# Patient Record
Sex: Male | Born: 1955
Health system: Southern US, Community
[De-identification: ages and names within clinical notes are randomized; demographics above are authoritative.]

## PROBLEM LIST (undated history)

## (undated) DIAGNOSIS — B019 Varicella without complication: Secondary | ICD-10-CM

## (undated) DIAGNOSIS — I871 Compression of vein: Secondary | ICD-10-CM

## (undated) DIAGNOSIS — R569 Unspecified convulsions: Secondary | ICD-10-CM

## (undated) DIAGNOSIS — B399 Histoplasmosis, unspecified: Secondary | ICD-10-CM

## (undated) HISTORY — DX: Varicella without complication: B01.9

## (undated) HISTORY — DX: Unspecified convulsions: R56.9

## (undated) HISTORY — DX: Compression of vein: I87.1

## (undated) HISTORY — DX: Histoplasmosis, unspecified: B39.9

## (undated) HISTORY — PX: OTHER SURGICAL HISTORY: SHX169

---

## 1993-03-24 HISTORY — PX: CHOLECYSTECTOMY: SHX55

## 2014-08-16 ENCOUNTER — Ambulatory Visit (INDEPENDENT_AMBULATORY_CARE_PROVIDER_SITE_OTHER): Payer: 59 | Admitting: Family Medicine

## 2014-08-16 ENCOUNTER — Encounter: Payer: Self-pay | Admitting: Family Medicine

## 2014-08-16 VITALS — BP 132/86 | HR 78 | Temp 97.8°F | Ht 71.0 in | Wt 190.0 lb

## 2014-08-16 DIAGNOSIS — Z0000 Encounter for general adult medical examination without abnormal findings: Secondary | ICD-10-CM

## 2014-08-16 DIAGNOSIS — Z1211 Encounter for screening for malignant neoplasm of colon: Secondary | ICD-10-CM

## 2014-08-16 DIAGNOSIS — I871 Compression of vein: Secondary | ICD-10-CM | POA: Insufficient documentation

## 2014-08-16 DIAGNOSIS — B399 Histoplasmosis, unspecified: Secondary | ICD-10-CM

## 2014-08-16 DIAGNOSIS — Z23 Encounter for immunization: Secondary | ICD-10-CM | POA: Diagnosis not present

## 2014-08-16 DIAGNOSIS — Z8619 Personal history of other infectious and parasitic diseases: Secondary | ICD-10-CM | POA: Insufficient documentation

## 2014-08-16 NOTE — Progress Notes (Signed)
Kristopher Conch, MD Phone: [phone]  Subjective:  Patient presents today to establish care. Last seen primary care around 2012 in [facility] Chief complaint-noted.   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 Decreased lung function. Treated with dilantin and coumadin reportedly. Able to walk now and really light bike riding. Takes low dose aspirin now .   3x a week exercise- walk or golf. overweight  6 hours sleep- advised 7-8 hours.   ROS- fatigued/winded with activity for years but no worsening, no chest pain, shortness of breath, walks without shortness of breath, no nausea.   The following were reviewed and entered/updated in epic: Past Medical History  Diagnosis Date  . Chicken pox   . Superior vena cava syndrome     states related to histoplasmosis  . Histoplasmosis     had a trach and noted to have "encasing of trachea"-had been on dilantain and coumadin in past   Patient Active Problem List   Diagnosis Date Noted  . Superior vena cava syndrome   . Histoplasmosis    Past Surgical History  Procedure Laterality Date  . Cholecystectomy  1995    Family History  Problem Relation Age of Onset  . Osteoarthritis Mother   . Hypertension Father   . Diabetes Father   . Diabetes Sister   . Congenital heart disease Brother     passed from this    Medications- reviewed and updated Current Outpatient Prescriptions  Medication Sig Dispense Refill  . aspirin 81 MG tablet Take 81 mg by mouth every other day.     No current facility-administered medications for this visit.    Allergies-reviewed and updated Allergies  Allergen Reactions  . Codeine Nausea And Vomiting  . Phenergan [Promethazine Hcl] Nausea And Vomiting    History   Social History  . Marital Status: Married    Spouse Name: N/A  . Number of Children: N/A  . Years of  Education: N/A   Social History Main Topics  . Smoking status: Never Smoker   . Smokeless tobacco: Not on file  . Alcohol Use: No  . Drug Use: No  . Sexual Activity: Not on file   Other Topics Concern  . Not on file   Social History Narrative   Family: Married. 2 grandsons in the home. 3 grown children. 10 grandkids total. No greatgrandkids.       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       Hobbies: swimming some, casual basketball, walking, writing- wrote sports in the past, now does a highly infrequent blog per him    ROS--See HPI , otherwise full ROS was completed and negative except as noted above  Objective: BP 132/86 mmHg  Pulse 78  Temp(Src) 97.8 F (36.6 C)  Ht  (1.803 m)  Wt 190 lb (86.183 kg)  BMI 26.51 kg/m2 Gen: NAD, resting comfortably HEENT: Mucous membranes are moist. Oropharynx normal. TM normal. Eyes: sclera and lids normal, PERRLA Neck: no thyromegaly, no cervical lymphadenopathy CV: RRR no murmurs rubs or gallops Lungs: CTAB no crackles, wheeze, rhonchi Abdomen: soft/nontender/nondistended/normal bowel sounds. No rebound or guarding.  Ext: no edema, 2+ DP and PT pulses Skin: warm, dry, no rash Neuro: 5/5 strength in upper and lower extremities, normal gait, normal reflexes  Assessment/Plan:  59 y.o. male presenting for annual physical.  Health  Maintenance counseling: 1. Anticipatory guidance: Patient counseled regarding regular dental exams (hasn't had one), wearing seatbelts.  2. Risk factor reduction:  Advised patient of need for regular exercise and diet rich and fruits and vegetables (mainly chicken and fish for meat) to reduce risk of heart attack and stroke.  3. Immunizations/screenings/ancillary studies Health Maintenance Due  Topic Date Due  . TETANUS/TDAP - today 10/18/1973  . COLONOSCOPY - refer today  10/18/2004   1 year CPE  Future fasting labs Orders Placed This  Encounter  Procedures  . Tdap vaccine greater than or equal to 7yo IM  . CBC with Differential/Platelet    Standing Status: Future     Number of Occurrences:      Standing Expiration Date: 08/16/2015  . Comprehensive metabolic panel    [place]    Standing Status: Future     Number of Occurrences:      Standing Expiration Date: 08/16/2015    Order Specific Question:  Has the patient fasted?    Answer:  No  . Lipid panel    [place]    Standing Status: Future     Number of Occurrences:      Standing Expiration Date: 08/16/2015    Order Specific Question:  Has the patient fasted?    Answer:  No  . TSH    [place]    Standing Status: Future     Number of Occurrences:      Standing Expiration Date: 08/16/2015  . Ambulatory referral to Gastroenterology    Referral Priority:  Routine    Referral Type:  Consultation    Referral Reason:  Specialty Services Required    Requested Specialty:  Gastroenterology    Number of Visits Requested:  1  . POCT urinalysis dipstick    In house    Standing Status: Future     Number of Occurrences:      Standing Expiration Date: 08/16/2015    Meds ordered this encounter  Medications  . aspirin 81 MG tablet    Sig: Take 81 mg by mouth every other day.

## 2014-08-16 NOTE — Patient Instructions (Addendum)
We will call you within a week about your referral for colonoscopy. If you do not hear within 2 weeks, give us a call.   Tdap given today  Great job with the 3x a week exercise despite your limitations  See handouts about prostate cancer screening. You declined today.   Schedule a lab visit at the front desk in the next 1-2 weeks. Return for future fasting labs- Nothing but water after midnight please.

## 2014-08-30 ENCOUNTER — Other Ambulatory Visit: Payer: 59

## 2015-08-23 NOTE — Progress Notes (Signed)
Noted  

## 2015-08-23 NOTE — Progress Notes (Signed)
Pt seen by Dr. Durene CalHunter 5/25 to establish care.

## 2015-08-23 NOTE — Progress Notes (Signed)
Dr Durene CalHunter saw pt on 5/25

## 2015-08-30 NOTE — Progress Notes (Signed)
Left voicemail message for return phone call

## 2015-09-03 ENCOUNTER — Telehealth: Payer: Self-pay | Admitting: Family Medicine

## 2015-09-03 NOTE — Telephone Encounter (Signed)
Called patient to advise CPE with labs a week beforehand- will be transferred to scheduling

## 2015-12-27 ENCOUNTER — Other Ambulatory Visit: Payer: Self-pay

## 2016-01-03 ENCOUNTER — Encounter: Payer: Self-pay | Admitting: Family Medicine

## 2016-01-03 DIAGNOSIS — Z0289 Encounter for other administrative examinations: Secondary | ICD-10-CM

## 2016-12-22 ENCOUNTER — Encounter: Payer: Self-pay | Admitting: Family Medicine

## 2016-12-22 ENCOUNTER — Ambulatory Visit (INDEPENDENT_AMBULATORY_CARE_PROVIDER_SITE_OTHER): Payer: Managed Care, Other (non HMO) | Admitting: Family Medicine

## 2016-12-22 VITALS — BP 112/80 | HR 67 | Temp 98.1°F | Ht 71.0 in | Wt 187.0 lb

## 2016-12-22 DIAGNOSIS — I871 Compression of vein: Secondary | ICD-10-CM

## 2016-12-22 DIAGNOSIS — Z0000 Encounter for general adult medical examination without abnormal findings: Secondary | ICD-10-CM

## 2016-12-22 DIAGNOSIS — Z1159 Encounter for screening for other viral diseases: Secondary | ICD-10-CM

## 2016-12-22 DIAGNOSIS — Z1211 Encounter for screening for malignant neoplasm of colon: Secondary | ICD-10-CM

## 2016-12-22 DIAGNOSIS — Z13220 Encounter for screening for lipoid disorders: Secondary | ICD-10-CM

## 2016-12-22 DIAGNOSIS — Z125 Encounter for screening for malignant neoplasm of prostate: Secondary | ICD-10-CM | POA: Diagnosis not present

## 2016-12-22 DIAGNOSIS — Z8619 Personal history of other infectious and parasitic diseases: Secondary | ICD-10-CM | POA: Diagnosis not present

## 2016-12-22 NOTE — Assessment & Plan Note (Signed)
Patient with history of histoplasmosis years ago which reportedly led to supervior vena cava syndrome. He has some residual very mild shortness of breath. Also has some varicosities in chest wall that are mild.

## 2016-12-22 NOTE — Patient Instructions (Signed)
Schedule a lab visit at the check out desk within 2 weeks. Return for future fasting labs meaning nothing but water after midnight please. Ok to take your medications with water.   Please take your aspirin  everyday  Great job listening to your body

## 2016-12-22 NOTE — Progress Notes (Signed)
Phone: [phone]  Subjective:  Patient presents today for their annual physical. Chief complaint-noted.   See problem oriented charting- ROS- full  review of systems was completed and negative except for: various arthralgias- none particularly severe.   The following were reviewed and entered/updated in epic: Past Medical History:  Diagnosis Date  . Chicken pox   . 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 Superior vena cava syndrome    states related to histoplasmosis   Patient Active Problem List   Diagnosis Date Noted  . Superior vena cava syndrome   . History of histoplasmosis    Past Surgical History:  Procedure Laterality Date  . CHOLECYSTECTOMY  1995    Family History  Problem Relation Age of Onset  . Osteoarthritis Mother   . Hypertension Father   . Diabetes Father   . Diabetes Sister   . Congenital heart disease Brother        passed from this    Medications- reviewed and updated Current Outpatient Prescriptions  Medication Sig Dispense Refill  . aspirin 81 MG tablet Take 81 mg by mouth every other day.     No current facility-administered medications for this visit.     Allergies-reviewed and updated Allergies  Allergen Reactions  . Codeine Nausea And Vomiting  . Phenergan [Promethazine Hcl] Nausea And Vomiting    Social History   Social History  . Marital status: Married    Spouse name: N/A  . Number of children: N/A  . Years of education: N/A   Social History Main Topics  . Smoking status: Never Smoker  . Smokeless tobacco: Never Used  . Alcohol use No  . Drug use: No  . Sexual activity: Not Asked   Other Topics Concern  . None   Social History Narrative   Family: Married. 2 grandsons in the home. 3 grown children. 11 grandkids total. No greatgrandkids.       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       Hobbies:   casual basketball, walking, writing- wrote sports in the past, now does a highly infrequent blog per him    Objective: BP 112/80 (BP Location: Right Arm, Patient Position: Sitting, Cuff Size: Normal)   Pulse 67   Temp 98.1 F (36.7 C) (Oral)   Ht  (1.803 m)   Wt 187 lb (84.8 kg)   SpO2 98%   BMI 26.08 kg/m  Gen: NAD, resting comfortably HEENT: Mucous membranes are moist. Oropharynx normal Varicosities of chest wall noted and into upper abdomen Neck: no thyromegaly CV: RRR no murmurs rubs or gallops Lungs: CTAB no crackles, wheeze, rhonchi Abdomen: soft/nontender/nondistended/normal bowel sounds. No rebound or guarding.  Ext: no edema Skin: warm, dry Neuro: grossly normal, moves all extremities, PERRLA Rectal: normal tone, diffusely enlarged prostate, no masses or tenderness  Assessment/Plan:  61 y.o. male presenting for annual physical.  Health Maintenance counseling: 1. Anticipatory guidance: Patient counseled regarding regular dental exams q6 months advised- plans to get this restarted, eye exams - 2 years ago- needs updated exam, wearing seatbelts.  2. Risk factor reduction:  Advised patient of need for regular exercise and diet rich and fruits and vegetables to reduce risk of heart attack and stroke. Exercise- at least twice a week if not 3x a week. Diet-veggie burger each morning. Does mainly chicken/fish. A lot of fruits- needs to increase veggies. Marland Kitchen  Wt Readings from Last 3 Encounters:  12/22/16 187 lb (84.8 kg)  08/16/14 190 lb (86.2 kg)  3. Immunizations/screenings/ancillary studies- declines flu shot. Will address shingrix when availability better.  Immunization History  Administered Date(s) Administered  . Tdap 08/16/2014   4. Prostate cancer screening- start getting PSA and trend. Rectal exam low risk . Some bph- some nocturia  5. Colon cancer screening - refer today for colonoscopy  Status of chronic or acute concerns   Superior vena cava  syndrome Patient with history of histoplasmosis years ago which reportedly led to supervior vena cava syndrome. He has some residual very mild shortness of breath. Also has some varicosities in chest wall that are mild.   Encouraged him to take his daily aspirin  Future Appointments Date Time Provider Department Center  01/05/2017 8:15 AM [facility] LAB [facility] None   1 year CPE  Future fasting labs Orders Placed This Encounter  Procedures  . CBC    Standing Status:   Future    Standing Expiration Date:   12/22/2017  . Comprehensive metabolic panel    [place]    Standing Status:   Future    Standing Expiration Date:   12/22/2017  . Lipid panel    Standing Status:   Future    Standing Expiration Date:   12/22/2017  . PSA    Standing Status:   Future    Standing Expiration Date:   12/22/2017  . Hepatitis C antibody    Standing Status:   Future    Standing Expiration Date:   12/22/2017  . Ambulatory referral to Gastroenterology    Referral Priority:   Routine    Referral Type:   Consultation    Referral Reason:   Specialty Services Required    Number of Visits Requested:   1   Return precautions advised.  Tana Conch, MD

## 2016-12-29 ENCOUNTER — Encounter: Payer: Self-pay | Admitting: Gastroenterology

## 2017-01-05 ENCOUNTER — Other Ambulatory Visit (INDEPENDENT_AMBULATORY_CARE_PROVIDER_SITE_OTHER): Payer: Managed Care, Other (non HMO)

## 2017-01-05 DIAGNOSIS — Z8619 Personal history of other infectious and parasitic diseases: Secondary | ICD-10-CM

## 2017-01-05 DIAGNOSIS — Z0000 Encounter for general adult medical examination without abnormal findings: Secondary | ICD-10-CM

## 2017-01-05 DIAGNOSIS — Z1159 Encounter for screening for other viral diseases: Secondary | ICD-10-CM | POA: Diagnosis not present

## 2017-01-05 DIAGNOSIS — Z13220 Encounter for screening for lipoid disorders: Secondary | ICD-10-CM | POA: Diagnosis not present

## 2017-01-05 DIAGNOSIS — I871 Compression of vein: Secondary | ICD-10-CM | POA: Diagnosis not present

## 2017-01-05 DIAGNOSIS — Z125 Encounter for screening for malignant neoplasm of prostate: Secondary | ICD-10-CM

## 2017-01-05 LAB — COMPREHENSIVE METABOLIC PANEL
ALBUMIN: 3.9 g/dL (ref 3.5–5.2)
ALK PHOS: 65 U/L (ref 39–117)
ALT: 15 U/L (ref 0–53)
AST: 19 U/L (ref 0–37)
BUN: 14 mg/dL (ref 6–23)
CALCIUM: 9.2 mg/dL (ref 8.4–10.5)
CO2: 24 mEq/L (ref 19–32)
Chloride: 108 mEq/L (ref 96–112)
Creatinine, Ser: 0.94 mg/dL (ref 0.40–1.50)
GFR: 104.51 mL/min (ref >60.00)
Glucose, Bld: 99 mg/dL (ref 70–99)
POTASSIUM: 4 meq/L (ref 3.5–5.1)
Sodium: 140 mEq/L (ref 135–145)
TOTAL PROTEIN: 6.8 g/dL (ref 6.0–8.3)
Total Bilirubin: 0.6 mg/dL (ref 0.2–1.2)

## 2017-01-05 LAB — PSA: PSA: 1.18 ng/mL (ref 0.10–4.00)

## 2017-01-05 LAB — LIPID PANEL
CHOLESTEROL: 186 mg/dL (ref 0–200)
HDL: 91 mg/dL (ref >39.00)
LDL Cholesterol: 85 mg/dL (ref 0–99)
NonHDL: 94.94
TRIGLYCERIDES: 52 mg/dL (ref 0.0–149.0)
Total CHOL/HDL Ratio: 2
VLDL: 10.4 mg/dL (ref 0.0–40.0)

## 2017-01-05 LAB — CBC
HEMATOCRIT: 43.5 % (ref 39.0–52.0)
HEMOGLOBIN: 14.2 g/dL (ref 13.0–17.0)
MCHC: 32.6 g/dL (ref 30.0–36.0)
MCV: 87.4 fl (ref 78.0–100.0)
Platelets: 203 10*3/uL (ref 150.0–400.0)
RBC: 4.98 Mil/uL (ref 4.22–5.81)
RDW: 13.5 % (ref 11.5–15.5)
WBC: 5.1 10*3/uL (ref 4.0–10.5)

## 2017-01-06 LAB — HEPATITIS C ANTIBODY
HEP C AB: NONREACTIVE
SIGNAL TO CUT-OFF: 0.02 (ref <1.00)

## 2017-02-05 ENCOUNTER — Telehealth: Payer: Self-pay

## 2017-02-05 NOTE — Telephone Encounter (Signed)
Please review condition and need for trach/Superior Vena Cava syndrome/Histoplasmosis/complications with trach/hx of Coumadin therapy.  Just wanting to be sure to proceed with [facility] appt. Angela/PV

## 2017-02-05 NOTE — Telephone Encounter (Signed)
Angela,  This pt is cleared for anesthetic care at [facility].  Thanks,  Michelene Keniston 

## 2017-02-06 NOTE — Telephone Encounter (Signed)
NotedBryson Pope.   B. Benitez, CMA

## 2017-02-11 ENCOUNTER — Telehealth: Payer: Self-pay | Admitting: *Deleted

## 2017-02-11 NOTE — Telephone Encounter (Signed)
Phone call to patient for no show for PV 02/11/17, 1st available PV 02/23/17. He is aware that if he misses this appointment colonoscopy will be cancelled and also aware that he is to stop eating nuts, seed, beans, peas, salads and raw vegetables starting 02/22/17

## 2017-02-19 ENCOUNTER — Telehealth: Payer: Self-pay

## 2017-02-19 NOTE — Telephone Encounter (Signed)
Copied from CRM [id number]. Topic: Medical Record Request - Other >> Feb 18, 2017 12:10 PM Pauline AusEvans, Kara B wrote: Patient needs medical evaluation form filled out for adoption services. Form is in providers folder in front office.    Called patient but no answer received. Voicemail box has not been set up yet. I will try again.

## 2017-02-23 ENCOUNTER — Ambulatory Visit (AMBULATORY_SURGERY_CENTER): Payer: Self-pay | Admitting: *Deleted

## 2017-02-23 ENCOUNTER — Other Ambulatory Visit: Payer: Self-pay

## 2017-02-23 VITALS — Ht 70.0 in | Wt 189.6 lb

## 2017-02-23 DIAGNOSIS — Z1211 Encounter for screening for malignant neoplasm of colon: Secondary | ICD-10-CM

## 2017-02-23 MED ORDER — SUPREP BOWEL PREP KIT 17.5-3.13-1.6 GM/177ML PO SOLN: 1.0000 | Freq: Once | ORAL | 0 refills | Status: AC

## 2017-02-23 NOTE — Progress Notes (Signed)
Patient denies any allergies to egg or soy products. Patient denies complications with anesthesia/sedation.  Patient denies oxygen use at home and denies diet medications.  Pamphlet given on Colonoscopy. 

## 2017-02-25 ENCOUNTER — Telehealth: Payer: Self-pay | Admitting: Gastroenterology

## 2017-02-25 NOTE — Telephone Encounter (Signed)
Called patient's cell # and the # listed for emergency contact, no answers. Did not leave message. Will try again.

## 2017-02-25 NOTE — Telephone Encounter (Signed)
Wife called and was notified by scheduler that we do not do prior auth. On Suprep. And that coupon will be faxed to pharmacy. I faxed Suprep coupon for the patient. Fax # [phone].

## 2017-02-27 ENCOUNTER — Encounter: Payer: Self-pay | Admitting: Gastroenterology

## 2017-02-27 ENCOUNTER — Ambulatory Visit (AMBULATORY_SURGERY_CENTER): Payer: Managed Care, Other (non HMO) | Admitting: Gastroenterology

## 2017-02-27 VITALS — BP 108/68 | HR 66 | Temp 97.3°F | Resp 14 | Ht 71.0 in | Wt 187.0 lb

## 2017-02-27 DIAGNOSIS — Z1211 Encounter for screening for malignant neoplasm of colon: Secondary | ICD-10-CM | POA: Diagnosis present

## 2017-02-27 DIAGNOSIS — Z1212 Encounter for screening for malignant neoplasm of rectum: Secondary | ICD-10-CM

## 2017-02-27 MED ORDER — SODIUM CHLORIDE 0.9 % IV SOLN: 500.0000 mL | Freq: Once | INTRAVENOUS | Status: DC

## 2017-02-27 NOTE — Progress Notes (Signed)
To PACU, VSS. Report to [email or web address] 

## 2017-02-27 NOTE — Patient Instructions (Signed)
YOU HAD AN ENDOSCOPIC PROCEDURE TODAY AT THE [facility]:   Refer to the procedure report that was given to you for any specific questions about what was found during the examination.  If the procedure report does not answer your questions, please call your gastroenterologist to clarify.  If you requested that your care partner not be given the details of your procedure findings, then the procedure report has been included in a sealed envelope for you to review at your convenience later.  YOU SHOULD EXPECT: Some feelings of bloating in the abdomen. Passage of more gas than usual.  Walking can help get rid of the air that was put into your GI tract during the procedure and reduce the bloating. If you had a lower endoscopy (such as a colonoscopy or flexible sigmoidoscopy) you may notice spotting of blood in your stool or on the toilet paper. If you underwent a bowel prep for your procedure, you may not have a normal bowel movement for a few days.  Please Note:  You might notice some irritation and congestion in your nose or some drainage.  This is from the oxygen used during your procedure.  There is no need for concern and it should clear up in a day or so.  SYMPTOMS TO REPORT IMMEDIATELY:   Following lower endoscopy (colonoscopy or flexible sigmoidoscopy):  Excessive amounts of blood in the stool  Significant tenderness or worsening of abdominal pains  Swelling of the abdomen that is new, acute  Fever of 100F or higher   For urgent or emergent issues, a gastroenterologist can be reached at any hour by calling [phone].   DIET:  We do recommend a small meal at first, but then you may proceed to your regular diet.  Drink plenty of fluids but you should avoid alcoholic beverages for 24 hours.  ACTIVITY:  You should plan to take it easy for the rest of today and you should NOT DRIVE or use heavy machinery until tomorrow (because of the sedation medicines used during the test).     FOLLOW UP: Our staff will call the number listed on your records the next business day following your procedure to check on you and address any questions or concerns that you may have regarding the information given to you following your procedure. If we do not reach you, we will leave a message.  However, if you are feeling well and you are not experiencing any problems, there is no need to return our call.  We will assume that you have returned to your regular daily activities without incident.  If any biopsies were taken you will be contacted by phone or by letter within the next 1-3 weeks.  Please call us at [phone] if you have not heard about the biopsies in 3 weeks.    SIGNATURES/CONFIDENTIALITY: You and/or your care partner have signed paperwork which will be entered into your electronic medical record.  These signatures attest to the fact that that the information above on your After Visit Summary has been reviewed and is understood.  Full responsibility of the confidentiality of this discharge information lies with you and/or your care-partner.  Thank you for letting us take care of your healthcare needs today. 

## 2017-02-27 NOTE — Progress Notes (Signed)
Pt's states no medical or surgical changes since previsit or office visit. 

## 2017-02-27 NOTE — Op Note (Signed)
[facility] Patient Name: Kristopher Pope Procedure Date: 02/27/2017 8:34 AM MRN: [id number] Endoscopist: Viviann SpareSteven P. Armbruster MD, MD Age: [age] Referring MD:  Date of Birth: 1955/10/13 Gender: Male Account #: [id number] Procedure:                Colonoscopy Indications:              Screening for colorectal malignant neoplasm, This                            is the patient's first colonoscopy Medicines:                Monitored Anesthesia Care Procedure:                Pre-Anesthesia Assessment:                           - Prior to the procedure, a History and Physical                            was performed, and patient medications and                            allergies were reviewed. The patient's tolerance of                            previous anesthesia was also reviewed. The risks                            and benefits of the procedure and the sedation                            options and risks were discussed with the patient.                            All questions were answered, and informed consent                            was obtained. Prior Anticoagulants: The patient has                            taken no previous anticoagulant or antiplatelet                            agents. ASA Grade Assessment: II - A patient with                            mild systemic disease. After reviewing the risks                            and benefits, the patient was deemed in                            satisfactory condition to undergo the procedure.  After obtaining informed consent, the colonoscope                            was passed under direct vision. Throughout the                            procedure, the patient's blood pressure, pulse, and                            oxygen saturations were monitored continuously. The                            Colonoscope was introduced through the anus and                            advanced to the the  cecum, identified by                            appendiceal orifice and ileocecal valve. The                            colonoscopy was performed without difficulty. The                            patient tolerated the procedure well. The quality                            of the bowel preparation was good. The ileocecal                            valve, appendiceal orifice, and rectum were                            photographed. Scope In: 8:37:42 AM Scope Out: 8:56:26 AM Scope Withdrawal Time: 0 hours 15 minutes 38 seconds  Total Procedure Duration: 0 hours 18 minutes 44 seconds  Findings:                 The perianal and digital rectal examinations were                            normal.                           Multiple medium-mouthed diverticula were found in                            the entire colon, mild in transverse and right                            colon, most severe in sigmoid.                           Internal hemorrhoids were found during retroflexion.  The exam was otherwise without abnormality. No                            polyps Complications:            No immediate complications. Estimated blood loss:                            None. Estimated Blood Loss:     Estimated blood loss: none. Impression:               - Diverticulosis in the entire examined colon.                           - Internal hemorrhoids.                           - The examination was otherwise normal.                           - No specimens collected. Recommendation:           - Patient has a contact number available for                            emergencies. The signs and symptoms of potential                            delayed complications were discussed with the                            patient. Return to normal activities tomorrow.                            Written discharge instructions were provided to the                            patient.                            - Resume previous diet.                           - Continue present medications.                           - Await pathology results.                           - Repeat colonoscopy in 10 years for screening                            purposes. Viviann Spare P. Armbruster MD, MD 02/27/2017 8:59:33 AM This report has been signed electronically.

## 2017-03-04 ENCOUNTER — Telehealth: Payer: Self-pay | Admitting: *Deleted

## 2017-03-04 NOTE — Telephone Encounter (Signed)
  Follow up Call-  Call back number 02/27/2017  Post procedure Call Back phone  # [phone]  Permission to leave phone message Yes  Some recent data might be hidden     Patient questions:  Do you have a fever, pain , or abdominal swelling? No. Pain Score  0 *  Have you tolerated food without any problems? Yes.    Have you been able to return to your normal activities? Yes.    Do you have any questions about your discharge instructions: Diet   No. Medications  No. Follow up visit  No.  Do you have questions or concerns about your Care? No.  Actions: * If pain score is 4 or above: No action needed, pain <4.

## 2017-03-04 NOTE — Telephone Encounter (Signed)
No answer, no voicemail available

## 2018-06-10 ENCOUNTER — Encounter: Payer: Managed Care, Other (non HMO) | Admitting: Family Medicine

## 2018-06-10 ENCOUNTER — Other Ambulatory Visit: Payer: Self-pay

## 2018-06-10 ENCOUNTER — Ambulatory Visit (INDEPENDENT_AMBULATORY_CARE_PROVIDER_SITE_OTHER): Payer: BLUE CROSS/BLUE SHIELD | Admitting: Family Medicine

## 2018-06-10 ENCOUNTER — Encounter: Payer: Self-pay | Admitting: Family Medicine

## 2018-06-10 VITALS — BP 90/60 | HR 77 | Temp 98.1°F | Ht 69.5 in | Wt 191.0 lb

## 2018-06-10 DIAGNOSIS — Z125 Encounter for screening for malignant neoplasm of prostate: Secondary | ICD-10-CM

## 2018-06-10 DIAGNOSIS — Z13220 Encounter for screening for lipoid disorders: Secondary | ICD-10-CM | POA: Diagnosis not present

## 2018-06-10 DIAGNOSIS — Z0000 Encounter for general adult medical examination without abnormal findings: Secondary | ICD-10-CM | POA: Diagnosis not present

## 2018-06-10 DIAGNOSIS — I871 Compression of vein: Secondary | ICD-10-CM | POA: Diagnosis not present

## 2018-06-10 DIAGNOSIS — Z6827 Body mass index (BMI) 27.0-27.9, adult: Secondary | ICD-10-CM

## 2018-06-10 LAB — LIPID PANEL
CHOL/HDL RATIO: 2
Cholesterol: 197 mg/dL (ref 0–200)
HDL: 81 mg/dL (ref >39.00)
LDL Cholesterol: 101 mg/dL — ABNORMAL HIGH (ref 0–99)
NONHDL: 115.54
TRIGLYCERIDES: 74 mg/dL (ref 0.0–149.0)
VLDL: 14.8 mg/dL (ref 0.0–40.0)

## 2018-06-10 LAB — CBC
HCT: 41.6 % (ref 39.0–52.0)
Hemoglobin: 13.7 g/dL (ref 13.0–17.0)
MCHC: 32.9 g/dL (ref 30.0–36.0)
MCV: 86.2 fl (ref 78.0–100.0)
PLATELETS: 184 10*3/uL (ref 150.0–400.0)
RBC: 4.82 Mil/uL (ref 4.22–5.81)
RDW: 13.8 % (ref 11.5–15.5)
WBC: 6.3 10*3/uL (ref 4.0–10.5)

## 2018-06-10 LAB — COMPREHENSIVE METABOLIC PANEL
ALK PHOS: 71 U/L (ref 39–117)
ALT: 18 U/L (ref 0–53)
AST: 18 U/L (ref 0–37)
Albumin: 4.1 g/dL (ref 3.5–5.2)
BILIRUBIN TOTAL: 0.7 mg/dL (ref 0.2–1.2)
BUN: 21 mg/dL (ref 6–23)
CO2: 27 mEq/L (ref 19–32)
CREATININE: 1.26 mg/dL (ref 0.40–1.50)
Calcium: 9.4 mg/dL (ref 8.4–10.5)
Chloride: 107 mEq/L (ref 96–112)
GFR: 69.8 mL/min (ref >60.00)
GLUCOSE: 98 mg/dL (ref 70–99)
Potassium: 3.9 mEq/L (ref 3.5–5.1)
Sodium: 141 mEq/L (ref 135–145)
TOTAL PROTEIN: 6.9 g/dL (ref 6.0–8.3)

## 2018-06-10 LAB — PSA: PSA: 1.02 ng/mL (ref 0.10–4.00)

## 2018-06-10 NOTE — Patient Instructions (Addendum)
Health Maintenance Due  Topic Date Due  . INFLUENZA VACCINE - patient declined 10/22/2017   Please stop by lab before you go If you do not have mychart- we will call you about results within 5 business days of [place] receiving them.  If you have mychart- we will send your results within 3 business days of [place] receiving them.  If abnormal or we want to clarify a result, we will call or mychart you to make sure you receive the message.  If you have questions or concerns or don't hear within 5-7 days, please send [place] a message or call us.

## 2018-06-10 NOTE — Progress Notes (Signed)
Phone: [phone]   Subjective:  Patient presents today for their annual physical. Chief complaint-noted.   See problem oriented charting- ROS- full  review of systems was completed and negative except for: diarrhea if has a certain tea, back pain if really physical work with digginf ro example.   BMI monitoring- elevated BMI noted: Body mass index is 27.8 kg/m. Encouraged need for healthy eating, regular exercise, weight loss.   BMI Metric Follow Up - 06/10/18 1143      BMI Metric Follow Up-Please document annually   BMI Metric Follow Up  Education provided       The following were reviewed and entered/updated in epic: Past Medical History:  Diagnosis Date  . Chicken pox   . 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 Superior vena cava syndrome   . History of histoplasmosis    Past Surgical History:  Procedure Laterality Date  . CHOLECYSTECTOMY  1995  . left wrist surgery     cyst    Family History  Problem Relation Age of Onset  . Osteoarthritis Mother   . Hypertension Father   . Diabetes Father   . Diabetes Sister   . Congenital heart disease Brother        passed from this  . Colon cancer Neg Hx   . Colon polyps Neg Hx   . Rectal cancer Neg Hx   . Stomach cancer Neg Hx     Medications- reviewed and updated Current Outpatient Medications  Medication Sig Dispense Refill  . aspirin 81 MG tablet Take 81 mg by mouth every other day.     No current facility-administered medications for this visit.     Allergies-reviewed and updated Allergies  Allergen Reactions  . Codeine Nausea And Vomiting  . Phenergan [Promethazine Hcl] Nausea And Vomiting    Social History   Social History Narrative   Family: Married. 2 grandsons in the home. 3  grown children. 11 grandkids total. No greatgrandkids.       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       Hobbies:  casual basketball, walking, writing- wrote sports in the past, now does a highly infrequent blog per him   Objective  Objective:  BP 90/60 (BP Location: Left Arm, Patient Position: Sitting, Cuff Size: Large)   Pulse 77   Temp 98.1 F (36.7 C) (Oral)   Ht 5' 9.5" (1.765 m)   Wt 191 lb (86.6 kg)   SpO2 95%   BMI 27.80 kg/m  Gen: NAD, resting comfortably HEENT: Mucous membranes are moist. Oropharynx normal Still with varicosities of chest wall noted in the upper abdomen Neck: no thyromegaly CV: RRR no murmurs rubs or gallops Lungs: CTAB no crackles, wheeze, rhonchi Abdomen: soft/nontender/nondistended/normal bowel sounds. No rebound or guarding.  Ext: no edema Skin: warm, dry Neuro: grossly normal, moves all extremities, PERRLA   Assessment and Plan  63 y.o. male presenting for annual physical.  Health Maintenance counseling: 1. Anticipatory guidance: Patient counseled regarding regular dental exams - needs to set up q6 months, eye exams - every other yearly,  avoiding smoking and second hand smoke , limiting alcohol to 2 beverages per day .   2. Risk factor reduction:  Advised  patient of need for regular exercise and diet rich and fruits and vegetables to reduce risk of heart attack and stroke. Exercise-  Feels like he needs to increase this- getting out and doing some golfing- discussed considering walking. Diet-diet through wife's employer- trying ot improve diet. Trying to do mainly fish, chicken for meat and doing fair amount of veggies Wt Readings from Last 3 Encounters:  06/10/18 191 lb (86.6 kg)  02/27/17 187 lb (84.8 kg)  02/23/17 189 lb 9.6 oz (86 kg)  3. Immunizations/screenings/ancillary studies-declines flu shot this year.  With current covid-19 environment-recommended delaying Shingrix Immunization History   Administered Date(s) Administered  . Influenza-Unspecified 12/22/2016  . Tdap 08/16/2014  4. Prostate cancer screening-we will trend PSA alone.  No significant change in urinary symptoms.  BPH on prior exam Lab Results  Component Value Date   PSA 1.18 01/05/2017   5. Colon cancer screening - December 2018 with 10-year follow-up planned 6. Skin cancer screening- low risk as african [place]. advised regular sunscreen use. Denies worrisome, changing, or new skin lesions.  7.  Never smoker   Status of chronic or acute concerns   Patient with history of histoplasmosis years ago which reportedly led to superior vena cava syndrome.  Continues to have very mild shortness of breath.  Has some varicosities on chest wall that are mild-continue to monitor.  We have encouraged him to take aspirin 81 mg daily.  1 year CPE   Lab/Order associations: NOT fasting  Preventative health care - Plan: CBC, Lipid panel, Comprehensive metabolic panel, PSA  Screening for prostate cancer - Plan: PSA  Screening for hyperlipidemia - Plan: Lipid panel  Superior vena cava syndrome - Plan: CBC, Comprehensive metabolic panel  BMI 27.0-27.9,adult  Return precautions advised.  Tana Conch, MD

## 2019-03-11 ENCOUNTER — Encounter (HOSPITAL_COMMUNITY): Payer: Self-pay

## 2019-03-11 ENCOUNTER — Other Ambulatory Visit: Payer: Self-pay

## 2019-03-11 ENCOUNTER — Ambulatory Visit (HOSPITAL_COMMUNITY)
Admission: EM | Admit: 2019-03-11 | Discharge: 2019-03-11 | Disposition: A | Discharge status: Home / Self Care | Payer: BC Managed Care – PPO

## 2019-03-11 DIAGNOSIS — K439 Ventral hernia without obstruction or gangrene: Secondary | ICD-10-CM | POA: Diagnosis not present

## 2019-03-11 NOTE — ED Triage Notes (Signed)
Pt. States between 1p-3p yesterday he suffered a hernia. He has abdominal soreness when touching his stomach.

## 2019-03-11 NOTE — ED Provider Notes (Signed)
[facility]    CSN: [id number] Arrival date & time: 03/11/19  1536      History   Chief Complaint Chief Complaint  Patient presents with  . Hernia    HPI Kristopher Pope is a 63 y.o. male.   Patient here concerned with "abdominal wall hernia" x 1 day ago.  He was moving logs from cut down tree and felt abdominal wall pain then noticed a "bulge."  Denies fever, chills, nausea, vomiting, diarrhea, constipation, hematochezia, melena, overlying erythema, warmth.  PSH lap chole, though hernia not through incision sites.     Past Medical History:  Diagnosis Date  . Chicken pox   . 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 Superior vena cava syndrome   . History of histoplasmosis     Past Surgical History:  Procedure Laterality Date  . CHOLECYSTECTOMY  1995  . left wrist surgery     cyst       Home Medications    Prior to Admission medications   Medication Sig Start Date End Date Taking? Authorizing Provider  aspirin 81 MG tablet Take 81 mg by mouth every other day.    [provider]    Family History Family History  Problem Relation Age of Onset  . Osteoarthritis Mother   . Hypertension Father   . Diabetes Father   . Diabetes Sister   . Congenital heart disease Brother        passed from this  . Colon cancer Neg Hx   . Colon polyps Neg Hx   . Rectal cancer Neg Hx   . Stomach cancer Neg Hx     Social History Social History   Tobacco Use  . Smoking status: Never Smoker  . Smokeless tobacco: Never Used  Substance Use Topics  . Alcohol use: No    Alcohol/week: 0.0 standard drinks  . Drug use: No     Allergies   Codeine and Phenergan [promethazine hcl]   Review of Systems Review of Systems    Constitutional: Negative for chills, fatigue and fever.  Gastrointestinal: Positive for abdominal pain. Negative for abdominal distention, blood in stool, constipation, diarrhea, nausea and vomiting.  Skin: Negative for color change, pallor, rash and wound.  Neurological: Negative for dizziness and headaches.  Hematological: Does not bruise/bleed easily.  Psychiatric/Behavioral: Negative for agitation and sleep disturbance.     Physical Exam Triage Vital Signs ED Triage Vitals  Enc Vitals Group     BP 03/11/19 1622 125/84     Pulse Rate 03/11/19 1622 65     Resp 03/11/19 1622 18     Temp 03/11/19 1622 98.2 F (36.8 C)     Temp Source 03/11/19 1622 Oral     SpO2 03/11/19 1622 95 %     Weight 03/11/19 1621 178 lb (80.7 kg)     Height --      Head Circumference --      Peak Flow --      Pain Score 03/11/19 1621 4     Pain Loc --      Pain Edu? --      Excl. in [place]? --    No data found.  Updated Vital Signs BP 125/84 (BP  Location: Left Arm)   Pulse 65   Temp 98.2 F (36.8 C) (Oral)   Resp 18   Wt 178 lb (80.7 kg)   SpO2 95%   BMI 25.91 kg/m   Visual Acuity Right Eye Distance:   Left Eye Distance:   Bilateral Distance:    Right Eye Near:   Left Eye Near:    Bilateral Near:     Physical Exam Vitals and nursing note reviewed.  Constitutional:      General: He is not in acute distress.    Appearance: Normal appearance. He is well-developed and normal weight. He is not ill-appearing.  HENT:     Head: Normocephalic and atraumatic.  Eyes:     Extraocular Movements: Extraocular movements intact.     Conjunctiva/sclera: Conjunctivae normal.  Cardiovascular:     Rate and Rhythm: Normal rate and regular rhythm.     Heart sounds: No murmur.  Pulmonary:     Effort: Pulmonary effort is normal. No respiratory distress.     Breath sounds: Normal breath sounds.  Abdominal:     General: There is no distension.     Palpations: Abdomen is soft.     Tenderness: There is  no abdominal tenderness.     Hernia: A hernia (abdomina wall, 4 cm superior umbilicus, no overlying erythema, warmth.) is present.  Musculoskeletal:        General: Normal range of motion.     Cervical back: Neck supple.  Skin:    General: Skin is warm and dry.     Capillary Refill: Capillary refill takes less than 2 seconds.  Neurological:     General: No focal deficit present.     Mental Status: He is alert and oriented to person, place, and time.  Psychiatric:        Mood and Affect: Mood normal.        Behavior: Behavior normal.      UC Treatments / Results  Labs (all labs ordered are listed, but only abnormal results are displayed) Labs Reviewed - No data to display  EKG   Radiology No results found.  Procedures Procedures (including critical care time)  Medications Ordered in UC Medications - No data to display  Initial Impression / Assessment and Plan / UC Course  I have reviewed the triage vital signs and the nursing notes.  Pertinent labs & imaging results that were available during my care of the patient were reviewed by me and considered in my medical decision making (see chart for details).     Discussed red flag sx in detail with patient.  Advised him to go to ED if he has any worrisome signs.  Patient expressed understanding.  Advised him to contact general surgeon for evaluation and hernia repair. Final Clinical Impressions(s) / UC Diagnoses   Final diagnoses:  Abdominal wall hernia     Discharge Instructions     Follow up with general surgeon for abdominal wall hernia repair. Go to ER if you have ANY of red flag symptoms as discussed. Take ibuprofen or tylenol every 8 hours as needed for pain.    ED Prescriptions    None     PDMP not reviewed this encounter.   Evern Core, PA-C 03/11/19 1651

## 2019-03-11 NOTE — Discharge Instructions (Signed)
Follow up with general surgeon for abdominal wall hernia repair. Go to ER if you have ANY of red flag symptoms as discussed. Take ibuprofen or tylenol every 8 hours as needed for pain.

## 2019-03-30 DIAGNOSIS — R1012 Left upper quadrant pain: Secondary | ICD-10-CM | POA: Diagnosis not present

## 2019-04-01 ENCOUNTER — Other Ambulatory Visit: Payer: Self-pay | Admitting: General Surgery

## 2019-04-01 DIAGNOSIS — R1012 Left upper quadrant pain: Secondary | ICD-10-CM

## 2019-04-05 ENCOUNTER — Other Ambulatory Visit: Payer: Self-pay

## 2019-04-07 ENCOUNTER — Other Ambulatory Visit: Payer: Self-pay

## 2019-04-07 ENCOUNTER — Ambulatory Visit (INDEPENDENT_AMBULATORY_CARE_PROVIDER_SITE_OTHER): Payer: BC Managed Care – PPO | Admitting: Family Medicine

## 2019-04-07 ENCOUNTER — Encounter: Payer: Self-pay | Admitting: Family Medicine

## 2019-04-07 VITALS — BP 112/76 | HR 70 | Temp 97.8°F | Ht 69.5 in | Wt 186.0 lb

## 2019-04-07 DIAGNOSIS — Z8616 Personal history of COVID-19: Secondary | ICD-10-CM

## 2019-04-07 DIAGNOSIS — K439 Ventral hernia without obstruction or gangrene: Secondary | ICD-10-CM

## 2019-04-07 NOTE — Patient Instructions (Addendum)
Great to see you today. Glad you recovered through covid!   Kristopher Pope will give you an update if she was able to get through to [facility] [place] [facility] about CT scan- if not please call them to follow up - they should be able to get CT scan done soon  Recommended follow up: see you for physical in march

## 2019-04-07 NOTE — Progress Notes (Signed)
Phone [phone] In person visit   Subjective:   Kristopher Pope is a 64 y.o. year old very pleasant male patient who presents for/with See problem oriented charting Chief Complaint  Patient presents with  . Follow-up    Covid  . Hernia    mth ago; Pt is requesting a CT scan.    This visit occurred during the SARS-CoV-2 public health emergency.  Safety protocols were in place, including screening questions prior to the visit, additional usage of staff PPE, and extensive cleaning of exam room while observing appropriate contact time as indicated for disinfecting solutions.   Past Medical History-  Patient Active Problem List   Diagnosis Date Noted  . Superior vena cava syndrome   . History of histoplasmosis     Medications- reviewed and updated Current Outpatient Medications  Medication Sig Dispense Refill  . aspirin 81 MG tablet Take 81 mg by mouth every other day.     No current facility-administered medications for this visit.     Objective:  BP 112/76   Pulse 70   Temp 97.8 F (36.6 C) (Temporal)   Ht 5' 9.5" (1.765 m)   Wt 186 lb (84.4 kg)   SpO2 97%   BMI 27.07 kg/m  Gen: NAD, resting comfortably CV: RRR no murmurs rubs or gallops Lungs: CTAB no crackles, wheeze, rhonchi Abdomen: soft/nontender/nondistended/normal bowel sounds. No rebound or guarding. Left upper abdominal hernia noted.  Ext: no edema Skin: warm, dry    Assessment and Plan   # social update - considering retiring in august of 2021- at age 70  #  History of Covid  S:Patient had positive Covid with [facility] in November. Feeling much better.   2-3 weeks shortness of breath, headache, dizziness, loss of  smell. Some mental fog. He feels like all of this has cleared- no lingering issues A/P: patient appears to have had full recovery.    # Abdominal wall hernia S:last month working out in his year and felt a bulge in upper abdomen. asymptomatic other than tender bulge. Pain went down  after 3 days and now he cannot find it.   Talked to general surgery Dr. Marlou Starks and plan was for CT scan but that has not been set up yet.   No pain since that time. Cannot really feel the area unless he pushes deep on abdominal wall. He is taking it easy at home.  A/P: 64 year old male with abdominal wall hernia pending CT prior to surgery with Dr. Marlou Starks. He was asking for our help in getting CT set up- our team called their office to help try to get CT moved along. We also encouraged patient to call to follow up if any further issues  In regards to surgery- patient is able to complete 4 METS of activity without chest pain or shortness of breath and I do not believe he needs further workup. He is medically maximized for surgery. Has hyperlipidemia but do not think patient needs statin with 10 year ascvd risk under 7.5%. Patient does have a history of superior vena cava syndrome related to prior histoplasmosis- this should be noted by anesthesia and surgery but he can still proceed forward.    Recommended follow up: Scheduled for physical in march Future Appointments  Date Time Provider [place]  04/19/2019 10:30 AM [facility] CT 1 [facility] [address] [facility]  06/16/2019  9:40 AM Yong Channel Brayton Mars, MD [id number]    Lab/Order associations:   ICD-10-CM   1.  Abdominal wall hernia  K43.9   2. History of COVID-19  Z86.16    Return precautions advised.  Tana Conch, MD

## 2019-04-19 ENCOUNTER — Ambulatory Visit
Admission: RE | Admit: 2019-04-19 | Discharge: 2019-04-19 | Disposition: A | Discharge status: Home / Self Care | Payer: BC Managed Care – PPO | Source: Ambulatory Visit | Attending: General Surgery | Admitting: General Surgery

## 2019-04-19 DIAGNOSIS — N200 Calculus of kidney: Secondary | ICD-10-CM | POA: Diagnosis not present

## 2019-04-19 DIAGNOSIS — R1012 Left upper quadrant pain: Secondary | ICD-10-CM

## 2019-04-20 ENCOUNTER — Encounter: Payer: Self-pay | Admitting: Family Medicine

## 2019-04-29 DIAGNOSIS — Z20828 Contact with and (suspected) exposure to other viral communicable diseases: Secondary | ICD-10-CM | POA: Diagnosis not present

## 2019-04-29 DIAGNOSIS — Z1159 Encounter for screening for other viral diseases: Secondary | ICD-10-CM | POA: Diagnosis not present

## 2019-06-16 ENCOUNTER — Encounter: Payer: BLUE CROSS/BLUE SHIELD | Admitting: Family Medicine

## 2019-07-07 ENCOUNTER — Other Ambulatory Visit: Payer: Self-pay

## 2019-07-07 ENCOUNTER — Encounter: Payer: Self-pay | Admitting: Family Medicine

## 2019-07-07 ENCOUNTER — Ambulatory Visit (INDEPENDENT_AMBULATORY_CARE_PROVIDER_SITE_OTHER): Payer: BC Managed Care – PPO | Admitting: Family Medicine

## 2019-07-07 VITALS — BP 110/74 | HR 82 | Temp 98.7°F | Ht 71.0 in | Wt 186.0 lb

## 2019-07-07 DIAGNOSIS — M545 Low back pain, unspecified: Secondary | ICD-10-CM

## 2019-07-07 DIAGNOSIS — Z8619 Personal history of other infectious and parasitic diseases: Secondary | ICD-10-CM

## 2019-07-07 DIAGNOSIS — I871 Compression of vein: Secondary | ICD-10-CM | POA: Diagnosis not present

## 2019-07-07 DIAGNOSIS — Z125 Encounter for screening for malignant neoplasm of prostate: Secondary | ICD-10-CM

## 2019-07-07 DIAGNOSIS — E785 Hyperlipidemia, unspecified: Secondary | ICD-10-CM | POA: Diagnosis not present

## 2019-07-07 DIAGNOSIS — Z0000 Encounter for general adult medical examination without abnormal findings: Secondary | ICD-10-CM

## 2019-07-07 DIAGNOSIS — Z23 Encounter for immunization: Secondary | ICD-10-CM

## 2019-07-07 LAB — COMPREHENSIVE METABOLIC PANEL
ALT: 21 U/L (ref 0–53)
AST: 25 U/L (ref 0–37)
Albumin: 4.3 g/dL (ref 3.5–5.2)
Alkaline Phosphatase: 68 U/L (ref 39–117)
BUN: 16 mg/dL (ref 6–23)
CO2: 26 mEq/L (ref 19–32)
Calcium: 9.5 mg/dL (ref 8.4–10.5)
Chloride: 105 mEq/L (ref 96–112)
Creatinine, Ser: 1.14 mg/dL (ref 0.40–1.50)
GFR: 78.08 mL/min (ref >60.00)
Glucose, Bld: 97 mg/dL (ref 70–99)
Potassium: 3.9 mEq/L (ref 3.5–5.1)
Sodium: 138 mEq/L (ref 135–145)
Total Bilirubin: 0.5 mg/dL (ref 0.2–1.2)
Total Protein: 7.2 g/dL (ref 6.0–8.3)

## 2019-07-07 LAB — CBC WITH DIFFERENTIAL/PLATELET
Basophils Absolute: 0.1 10*3/uL (ref 0.0–0.1)
Basophils Relative: 0.9 % (ref 0.0–3.0)
Eosinophils Absolute: 0.2 10*3/uL (ref 0.0–0.7)
Eosinophils Relative: 2.3 % (ref 0.0–5.0)
HCT: 41.7 % (ref 39.0–52.0)
Hemoglobin: 13.7 g/dL (ref 13.0–17.0)
Lymphocytes Relative: 30.8 % (ref 12.0–46.0)
Lymphs Abs: 2.3 10*3/uL (ref 0.7–4.0)
MCHC: 32.9 g/dL (ref 30.0–36.0)
MCV: 87.1 fl (ref 78.0–100.0)
Monocytes Absolute: 0.5 10*3/uL (ref 0.1–1.0)
Monocytes Relative: 7.2 % (ref 3.0–12.0)
Neutro Abs: 4.4 10*3/uL (ref 1.4–7.7)
Neutrophils Relative %: 58.8 % (ref 43.0–77.0)
Platelets: 212 10*3/uL (ref 150.0–400.0)
RBC: 4.79 Mil/uL (ref 4.22–5.81)
RDW: 13.6 % (ref 11.5–15.5)
WBC: 7.6 10*3/uL (ref 4.0–10.5)

## 2019-07-07 LAB — LIPID PANEL
Cholesterol: 231 mg/dL — ABNORMAL HIGH (ref 0–200)
HDL: 90 mg/dL (ref >39.00)
LDL Cholesterol: 128 mg/dL — ABNORMAL HIGH (ref 0–99)
NonHDL: 141.14
Total CHOL/HDL Ratio: 3
Triglycerides: 65 mg/dL (ref 0.0–149.0)
VLDL: 13 mg/dL (ref 0.0–40.0)

## 2019-07-07 LAB — PSA: PSA: 1.63 ng/mL (ref 0.10–4.00)
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
## 2019-07-07 NOTE — Addendum Note (Signed)
Addended by: Lieutenant Diego A on: 07/07/2019 12:19 PM   Modules accepted: Orders

## 2019-07-07 NOTE — Patient Instructions (Addendum)
Please schedule a visit with your dentist-dental health is important for overall health.  I am glad you are try to find a new eye doctor-I think it is reasonable to get this updated at least every 2 years.  Sooner if you have issues.  Schedule a sports medicine visit at our [place] location before you leave today at the checkout desk.  Hope you get feeling better from the back perspective  Shingrix #1 today. Repeat injection in 2-3 months. Schedule a nurse visit for the 2nd injection before you leave today (at the check out desk)  Recommended follow up: Return in about 1 year (around 07/06/2020) for physical or sooner if needed.      Please stop by lab before you go If you do not have mychart- we will call you about results within 5 business days of [place] receiving them.  If you have mychart- we will send your results within 3 business days of [place] receiving them.  If abnormal or we want to clarify a result, we will call or mychart you to make sure you receive the message.  If you have questions or concerns or don't hear within 5 business days, please send [place] a message or call us.

## 2019-07-08 ENCOUNTER — Other Ambulatory Visit: Payer: Self-pay

## 2019-07-08 DIAGNOSIS — R972 Elevated prostate specific antigen [PSA]: Secondary | ICD-10-CM

## 2019-07-20 ENCOUNTER — Other Ambulatory Visit: Payer: Self-pay

## 2019-07-20 ENCOUNTER — Ambulatory Visit (INDEPENDENT_AMBULATORY_CARE_PROVIDER_SITE_OTHER): Payer: BC Managed Care – PPO | Admitting: Family Medicine

## 2019-07-20 ENCOUNTER — Encounter: Payer: Self-pay | Admitting: Family Medicine

## 2019-07-20 VITALS — BP 104/72 | HR 72 | Ht 71.0 in | Wt 182.8 lb

## 2019-07-20 DIAGNOSIS — S39012A Strain of muscle, fascia and tendon of lower back, initial encounter: Secondary | ICD-10-CM | POA: Diagnosis not present

## 2019-07-20 NOTE — Patient Instructions (Addendum)
Thank you for coming in today. Plan for PT.  Use heating pad and TENS unit.   Recheck in 4 week.  Let me know if you are not doing well.    Lumbosacral Strain Lumbosacral strain is an injury that causes pain in the lower back (lumbosacral spine). This injury usually happens from overstretching the muscles or ligaments along your spine. Ligaments are cord-like tissues that connect bones to other bones. A strain can affect one or more muscles or ligaments. What are the causes? This condition may be caused by:  A hard, direct hit to the back.  Overstretching the lower back muscles. This may result from: ? A fall. ? Lifting something heavy. ? Repetitive movements such as bending or crouching. What increases the risk? The following factors may make you more likely to develop this condition:  Participating in sports or activities that involve: ? A sudden twist of the back. ? Pushing or pulling motions.  Being overweight or obese.  Having poor strength and flexibility, especially tight hamstrings or weak muscles in the back or abdomen.  Having too much of a curve in the lower back.  Having a pelvis that is tilted forward. What are the signs or symptoms? The main symptom of this condition is pain in the lower back, at the site of the strain. Pain may also be felt down one or both legs. How is this diagnosed? This condition is diagnosed based on your symptoms, your medical history, and a physical exam. During the physical exam, your health care provider may push on certain areas of your back to find the source of your pain. You may be asked to bend forward, backward, and side to side to check your pain and range of motion. You may also have imaging tests, such as X-rays and an MRI. How is this treated? This condition may be treated by:  Applying heat and cold on the affected area.  Taking medicines to help relieve pain and relax your muscles.  Taking NSAIDs, such as ibuprofen, to  help reduce swelling and discomfort.  Doing stretching and strengthening exercises for your lower back. Symptoms usually improve within several weeks of treatment. However, recovery time varies. When your symptoms improve, gradually return to your normal routine as soon as possible to reduce pain, avoid stiffness, and keep muscle strength. Follow these instructions at home: Medicines  Take over-the-counter and prescription medicines only as told by your health care provider.  Ask your health care provider if the medicine prescribed to you: ? Requires you to avoid driving or using heavy machinery. ? Can cause constipation. You may need to take these actions to prevent or treat constipation:  Drink enough fluid to keep your urine pale yellow.  Take over-the-counter or prescription medicines.  Eat foods that are high in fiber, such as beans, whole grains, and fresh fruits and vegetables.  Limit foods that are high in fat and processed sugars, such as fried or sweet foods. Managing pain, stiffness, and swelling      If directed, put ice on the injured area. To do this: ? Put ice in a plastic bag. ? Place a towel between your skin and the bag. ? Leave the ice on for 20 minutes, 2-3 times a day.  If directed, apply heat on the affected area as often as told by your health care provider. Use the heat source that your health care provider recommends, such as a moist heat pack or a heating pad. ? Place a  towel between your skin and the heat source. ? Leave the heat on for 20-30 minutes. ? Remove the heat if your skin turns bright red. This is especially important if you are unable to feel pain, heat, or cold. You may have a greater risk of getting burned. Activity  Rest as told by your health care provider.  Do not stay in bed. Staying in bed for more than 1-2 days can delay your recovery.  Return to your normal activities as told by your health care provider. Ask your health care  provider what activities are safe for you.  Avoid activities that take a lot of energy for as long as told by your health care provider.  Do exercises as told by your health care provider. This includes stretching and strengthening exercises. General instructions  Sit up and stand up straight. Avoid leaning forward when you sit, or hunching over when you stand.  Do not use any products that contain nicotine or tobacco, such as cigarettes, e-cigarettes, and chewing tobacco. If you need help quitting, ask your health care provider.  Keep all follow-up visits as told by your health care provider. This is important. How is this prevented?   Use correct form when playing sports and lifting heavy objects.  Use good posture when sitting and standing.  Maintain a healthy weight.  Sleep on a mattress with medium firmness to support your back.  Do at least 150 minutes of moderate-intensity exercise each week, such as brisk walking or water aerobics. Try a form of exercise that takes stress off your back, such as swimming or stationary cycling.  Maintain physical fitness, including: ? Strength. ? Flexibility. Contact a health care provider if:  Your back pain does not improve after several weeks of treatment.  Your symptoms get worse. Get help right away if:  Your back pain is severe.  You cannot stand or walk.  You have difficulty controlling when you urinate or when you have a bowel movement.  You feel nauseous or you vomit.  Your feet or legs get very cold, turn pale, or look blue.  You have numbness, tingling, weakness, or problems using your arms or legs.  You develop any of the following: ? Shortness of breath. ? Dizziness. ? Pain in your legs. ? Weakness in your buttocks or legs. Summary  Lumbosacral strain is an injury that causes pain in the lower back (lumbosacral spine).  This injury usually happens from overstretching the muscles or ligaments along your  spine.  This condition may be caused by a direct hit to the lower back or by overstretching the lower back muscles.  Symptoms usually improve within several weeks of treatment. This information is not intended to replace advice given to you by your health care provider. Make sure you discuss any questions you have with your health care provider. Document Revised: 08/03/2018 Document Reviewed: 08/03/2018 Elsevier Patient Education  2020 [employer].
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
## 2019-08-03 ENCOUNTER — Ambulatory Visit: Payer: BC Managed Care – PPO | Attending: Family Medicine | Admitting: Physical Therapy

## 2019-08-03 ENCOUNTER — Encounter: Payer: Self-pay | Admitting: Physical Therapy

## 2019-08-03 ENCOUNTER — Other Ambulatory Visit: Payer: Self-pay

## 2019-08-03 DIAGNOSIS — M6281 Muscle weakness (generalized): Secondary | ICD-10-CM | POA: Diagnosis not present

## 2019-08-03 DIAGNOSIS — M545 Low back pain, unspecified: Secondary | ICD-10-CM
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
## 2019-08-17 ENCOUNTER — Ambulatory Visit: Payer: BC Managed Care – PPO | Admitting: Physical Therapy

## 2019-08-19 ENCOUNTER — Other Ambulatory Visit: Payer: Self-pay

## 2019-08-19 ENCOUNTER — Encounter: Payer: Self-pay | Admitting: Physical Therapy

## 2019-08-19 ENCOUNTER — Ambulatory Visit: Payer: BC Managed Care – PPO | Admitting: Physical Therapy

## 2019-08-19 DIAGNOSIS — J189 Pneumonia, unspecified organism: Secondary | ICD-10-CM | POA: Diagnosis not present

## 2019-08-19 DIAGNOSIS — Z885 Allergy status to narcotic agent status: Secondary | ICD-10-CM | POA: Diagnosis not present

## 2019-08-19 DIAGNOSIS — Z9049 Acquired absence of other specified parts of digestive tract: Secondary | ICD-10-CM | POA: Diagnosis not present

## 2019-08-19 DIAGNOSIS — Z79899 Other long term (current) drug therapy: Secondary | ICD-10-CM | POA: Diagnosis not present

## 2019-08-19 DIAGNOSIS — Z8616 Personal history of COVID-19: Secondary | ICD-10-CM | POA: Diagnosis not present

## 2019-08-19 DIAGNOSIS — M6281 Muscle weakness (generalized): Secondary | ICD-10-CM

## 2019-08-19 DIAGNOSIS — Z8249 Family history of ischemic heart disease and other diseases of the circulatory system: Secondary | ICD-10-CM | POA: Diagnosis not present

## 2019-08-19 DIAGNOSIS — I871 Compression of vein: Secondary | ICD-10-CM | POA: Diagnosis not present

## 2019-08-19 DIAGNOSIS — J439 Emphysema, unspecified: Secondary | ICD-10-CM | POA: Diagnosis not present

## 2019-08-19 DIAGNOSIS — M545 Low back pain, unspecified: Secondary | ICD-10-CM

## 2019-08-19 DIAGNOSIS — Z8709 Personal history of other diseases of the respiratory system: Secondary | ICD-10-CM | POA: Diagnosis not present

## 2019-08-19 DIAGNOSIS — R918 Other nonspecific abnormal finding of lung field: Secondary | ICD-10-CM | POA: Diagnosis not present

## 2019-08-19 DIAGNOSIS — J470 Bronchiectasis with acute lower respiratory infection: Secondary | ICD-10-CM | POA: Diagnosis not present

## 2019-08-19 DIAGNOSIS — Z23 Encounter for immunization: Secondary | ICD-10-CM | POA: Diagnosis not present

## 2019-08-19 DIAGNOSIS — R0789 Other chest pain: Secondary | ICD-10-CM | POA: Diagnosis not present

## 2019-08-19 DIAGNOSIS — Z7982 Long term (current) use of aspirin: Secondary | ICD-10-CM | POA: Diagnosis not present

## 2019-08-19 DIAGNOSIS — Z833 Family history of diabetes mellitus: Secondary | ICD-10-CM | POA: Diagnosis not present

## 2019-08-19 DIAGNOSIS — Z888 Allergy status to other drugs, medicaments and biological substances status: Secondary | ICD-10-CM | POA: Diagnosis not present

## 2019-08-19 DIAGNOSIS — Z20822 Contact with and (suspected) exposure to covid-19: Secondary | ICD-10-CM | POA: Diagnosis not present

## 2019-08-19 DIAGNOSIS — Z8619 Personal history of other infectious and parasitic diseases: Secondary | ICD-10-CM | POA: Diagnosis not present

## 2019-08-19 DIAGNOSIS — J9601 Acute respiratory failure with hypoxia: Secondary | ICD-10-CM | POA: Diagnosis not present

## 2019-08-19 DIAGNOSIS — I808 Phlebitis and thrombophlebitis of other sites: Secondary | ICD-10-CM | POA: Diagnosis not present

## 2019-08-19 DIAGNOSIS — M7989 Other specified soft tissue disorders: Secondary | ICD-10-CM | POA: Diagnosis not present

## 2019-08-19 DIAGNOSIS — I82211 Chronic embolism and thrombosis of superior vena cava: Secondary | ICD-10-CM | POA: Diagnosis not present

## 2019-08-19 NOTE — Therapy (Signed)
[place], [place], [address] Phone: [phone]   Fax:  [phone]  Physical Therapy Treatment  Patient Details  Name: Kristopher Pope MRN: [id number] Date of Birth: 11-19-1955 Referring Provider (PT): Garret Reddish   Encounter Date: 08/19/2019  PT End of Session - 08/19/19 0923    Visit Number  2    Date for PT Re-Evaluation  10/03/19    PT Start Time  0843    PT Stop Time  0925    PT Time Calculation (min)  42 min    Activity Tolerance  Patient tolerated treatment well    Behavior During Therapy  [facility] for tasks assessed/performed       Past Medical History:  Diagnosis Date  . Chicken pox   . 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 Seizures ([place])    around time of histoplasmosis diagnosis  . 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 CHOLECYSTECTOMY  1995  . left wrist surgery     cyst    There were no vitals filed for this visit.  Subjective Assessment - 08/19/19 0845    Subjective  "A little stiff this morning when I woke up"    Currently in Pain?  No/denies                        [facility] Adult PT Treatment/Exercise - 08/19/19 0001      Lumbar Exercises: Stretches   Passive Hamstring Stretch  Right;Left;4 reps;10 seconds    Single Knee to Chest Stretch  Left;Right;3 reps;10 seconds    Double Knee to Chest Stretch  1 rep;20 seconds;10 seconds    Lower Trunk Rotation  2 reps;10 seconds    ITB Stretch  Left;Right;2 reps;10 seconds;20 seconds      Lumbar Exercises: Aerobic   Nustep  L5 x 6 min       Lumbar Exercises: Machines for Strengthening   Cybex Knee Extension  10lb 2x10    Cybex Knee Flexion  25lb 2x10      Lumbar Exercises: Standing   Row  Theraband;20  reps;Both;Strengthening    Theraband Level (Row)  Level 3 (Green)    Shoulder Extension  Theraband;20 reps;Both;Strengthening    Theraband Level (Shoulder Extension)  Level 3 (Green)      Lumbar Exercises: Seated   Sit to Stand  20 reps   Lowered UBE seat      Lumbar Exercises: Supine   Bridge  10 reps;3 seconds               PT Short Term Goals - 08/19/19 0924      PT SHORT TERM GOAL #1   Title  Pt will be independent with HEP    Status  Partially Met        PT Long Term Goals - 08/19/19 0924      PT LONG TERM GOAL #3   Title  Pt will reduce reports of LBP by 50%    Status  Partially Met            Plan - 08/19/19 0924    Clinical Impression Statement  Pt tolerated an initial progression to TE well. Postural cues required with rows and extensions.  Core weakness noted with supine bridges. No reports of pain with the interventions. He has good LE flexibility noted with passive stretching.    Personal Factors and Comorbidities  Age    Examination-Activity Limitations  Bend;Carry;Lift;Sit;Sleep;Squat;Stairs;Stand    PT Frequency  2x / week    PT Duration  8 weeks    PT Treatment/Interventions  ADLs/Self Care Home Management;Electrical Stimulation;Moist Heat;Iontophoresis 65m/ml Dexamethasone;Gait training;Stair training;Functional mobility training;Therapeutic activities;Therapeutic exercise;Balance training;Neuromuscular re-education;Manual techniques;Patient/family education;Passive range of motion;Dry needling    PT Next Visit Plan  Initiate LE and lumbar strengthening/flexibility, manual/modalities as indicated       Patient will benefit from skilled therapeutic intervention in order to improve the following deficits and impairments:  Abnormal gait, Decreased range of motion, Difficulty walking, Decreased endurance, Increased muscle spasms, Decreased activity tolerance, Pain, Hypomobility, Impaired flexibility, Decreased strength, Decreased mobility  Visit  Diagnosis: Muscle weakness (generalized)  Acute bilateral low back pain without sciatica     Problem List Patient Active Problem List   Diagnosis Date Noted  . Hyperlipidemia 07/07/2019  . Superior vena cava syndrome   . History of histoplasmosis     RScot Jun PTA 08/19/2019, 9:25 AM  [place] [address]Phone: [phone]  Fax:  [phone] Name: Kristopher Pope MRN: [id number] of Birth: [birth year]

## 2019-08-20 ENCOUNTER — Emergency Department (HOSPITAL_COMMUNITY): Payer: BC Managed Care – PPO

## 2019-08-20 ENCOUNTER — Encounter (HOSPITAL_COMMUNITY): Payer: Self-pay | Admitting: Emergency Medicine

## 2019-08-20 ENCOUNTER — Inpatient Hospital Stay (HOSPITAL_COMMUNITY)
Admission: EM | Admit: 2019-08-20 | Discharge: 2019-08-24 | DRG: 193 | Disposition: A | Discharge status: Home / Self Care | Payer: BC Managed Care – PPO | Attending: Internal Medicine | Admitting: Internal Medicine

## 2019-08-20 ENCOUNTER — Other Ambulatory Visit: Payer: Self-pay

## 2019-08-20 DIAGNOSIS — Z885 Allergy status to narcotic agent status: Secondary | ICD-10-CM

## 2019-08-20 DIAGNOSIS — Z888 Allergy status to other drugs, medicaments and biological substances status: Secondary | ICD-10-CM

## 2019-08-20 DIAGNOSIS — Z8619 Personal history of other infectious and parasitic diseases: Secondary | ICD-10-CM

## 2019-08-20 DIAGNOSIS — Z20822 Contact with and (suspected) exposure to covid-19: Secondary | ICD-10-CM | POA: Diagnosis not present

## 2019-08-20 DIAGNOSIS — Z9049 Acquired absence of other specified parts of digestive tract: Secondary | ICD-10-CM

## 2019-08-20 DIAGNOSIS — J439 Emphysema, unspecified: Secondary | ICD-10-CM | POA: Diagnosis not present

## 2019-08-20 DIAGNOSIS — I871 Compression of vein: Secondary | ICD-10-CM | POA: Diagnosis not present

## 2019-08-20 DIAGNOSIS — Z8249 Family history of ischemic heart disease and other diseases of the circulatory system: Secondary | ICD-10-CM

## 2019-08-20 DIAGNOSIS — J9601 Acute respiratory failure with hypoxia: Secondary | ICD-10-CM | POA: Diagnosis present

## 2019-08-20 DIAGNOSIS — I808 Phlebitis and thrombophlebitis of other sites: Secondary | ICD-10-CM | POA: Diagnosis not present

## 2019-08-20 DIAGNOSIS — Z8709 Personal history of other diseases of the respiratory system: Secondary | ICD-10-CM

## 2019-08-20 DIAGNOSIS — Z79899 Other long term (current) drug therapy: Secondary | ICD-10-CM

## 2019-08-20 DIAGNOSIS — Z833 Family history of diabetes mellitus: Secondary | ICD-10-CM

## 2019-08-20 DIAGNOSIS — R0789 Other chest pain: Secondary | ICD-10-CM | POA: Diagnosis not present

## 2019-08-20 DIAGNOSIS — J470 Bronchiectasis with acute lower respiratory infection: Secondary | ICD-10-CM | POA: Diagnosis present

## 2019-08-20 DIAGNOSIS — Z23 Encounter for immunization: Secondary | ICD-10-CM

## 2019-08-20 DIAGNOSIS — R918 Other nonspecific abnormal finding of lung field: Secondary | ICD-10-CM | POA: Diagnosis present

## 2019-08-20 DIAGNOSIS — I82211 Chronic embolism and thrombosis of superior vena cava: Secondary | ICD-10-CM | POA: Diagnosis present

## 2019-08-20 DIAGNOSIS — Z7982 Long term (current) use of aspirin: Secondary | ICD-10-CM

## 2019-08-20 DIAGNOSIS — Z8616 Personal history of COVID-19: Secondary | ICD-10-CM

## 2019-08-20 DIAGNOSIS — J189 Pneumonia, unspecified organism: Secondary | ICD-10-CM | POA: Diagnosis not present

## 2019-08-20 LAB — CBC WITH DIFFERENTIAL/PLATELET
Abs Immature Granulocytes: 0.06 10*3/uL (ref 0.00–0.07)
Basophils Absolute: 0 10*3/uL (ref 0.0–0.1)
Basophils Relative: 0 %
Eosinophils Absolute: 0 10*3/uL (ref 0.0–0.5)
Eosinophils Relative: 0 %
HCT: 40.1 % (ref 39.0–52.0)
Hemoglobin: 13.1 g/dL (ref 13.0–17.0)
Immature Granulocytes: 1 %
Lymphocytes Relative: 5 %
Lymphs Abs: 0.6 10*3/uL — ABNORMAL LOW (ref 0.7–4.0)
MCH: 28.2 pg (ref 26.0–34.0)
MCHC: 32.7 g/dL (ref 30.0–36.0)
MCV: 86.4 fL (ref 80.0–100.0)
Monocytes Absolute: 0.8 10*3/uL (ref 0.1–1.0)
Monocytes Relative: 7 %
Neutro Abs: 9.8 10*3/uL — ABNORMAL HIGH (ref 1.7–7.7)
Neutrophils Relative %: 87 %
Platelets: 230 10*3/uL (ref 150–400)
RBC: 4.64 MIL/uL (ref 4.22–5.81)
RDW: 14.2 % (ref 11.5–15.5)
WBC: 11.3 10*3/uL — ABNORMAL HIGH (ref 4.0–10.5)
nRBC: 0 % (ref 0.0–0.2)

## 2019-08-20 LAB — COMPREHENSIVE METABOLIC PANEL
ALT: 29 U/L (ref 0–44)
AST: 27 U/L (ref 15–41)
Albumin: 4 g/dL (ref 3.5–5.0)
Alkaline Phosphatase: 63 U/L (ref 38–126)
Anion gap: 9 (ref 5–15)
BUN: 16 mg/dL (ref 8–23)
CO2: 23 mmol/L (ref 22–32)
Calcium: 9.1 mg/dL (ref 8.9–10.3)
Chloride: 103 mmol/L (ref 98–111)
Creatinine, Ser: 1.18 mg/dL (ref 0.61–1.24)
GFR calc Af Amer: >60 mL/min (ref >60)
GFR calc non Af Amer: >60 mL/min (ref >60)
Glucose, Bld: 139 mg/dL — ABNORMAL HIGH (ref 70–99)
Potassium: 3.7 mmol/L (ref 3.5–5.1)
Sodium: 135 mmol/L (ref 135–145)
Total Bilirubin: 0.9 mg/dL (ref 0.3–1.2)
Total Protein: 8.1 g/dL (ref 6.5–8.1)

## 2019-08-20 LAB — SARS CORONAVIRUS 2 BY RT PCR (HOSPITAL ORDER, PERFORMED IN ~~LOC~~ HOSPITAL LAB): SARS Coronavirus 2: NEGATIVE

## 2019-08-20 MED ORDER — PNEUMOCOCCAL VAC POLYVALENT 25 MCG/0.5ML IJ INJ
0.5000 mL | INJECTION | INTRAMUSCULAR | Status: AC
  Administered 2019-08-21: 0.5 mL via INTRAMUSCULAR
  Filled 2019-08-20: qty 0.5

## 2019-08-20 MED ORDER — ACETAMINOPHEN 650 MG RE SUPP: 650.0000 mg | Freq: Four times a day (QID) | RECTAL | Status: DC | PRN

## 2019-08-20 MED ORDER — SODIUM CHLORIDE 0.9 % IV SOLN
2.0000 g | INTRAVENOUS | Status: AC
  Administered 2019-08-20: 2 g via INTRAVENOUS
  Filled 2019-08-20: qty 2

## 2019-08-20 MED ORDER — ENOXAPARIN SODIUM 40 MG/0.4ML ~~LOC~~ SOLN
40.0000 mg | SUBCUTANEOUS | Status: DC
  Administered 2019-08-20 – 2019-08-23 (×4): 40 mg via SUBCUTANEOUS
  Filled 2019-08-20 (×4): qty 0.4

## 2019-08-20 MED ORDER — SODIUM CHLORIDE (PF) 0.9 % IJ SOLN
INTRAMUSCULAR | Status: AC
  Administered 2019-08-20: 10 mL
  Filled 2019-08-20: qty 50

## 2019-08-20 MED ORDER — IOHEXOL 300 MG/ML  SOLN
75.0000 mL | Freq: Once | INTRAMUSCULAR | Status: AC | PRN
  Administered 2019-08-20: 75 mL via INTRAVENOUS

## 2019-08-20 MED ORDER — ONDANSETRON HCL 4 MG/2ML IJ SOLN
4.0000 mg | Freq: Once | INTRAMUSCULAR | Status: AC
  Administered 2019-08-20: 4 mg via INTRAVENOUS
  Filled 2019-08-20: qty 2

## 2019-08-20 MED ORDER — ACETAMINOPHEN 325 MG PO TABS
650.0000 mg | ORAL_TABLET | Freq: Once | ORAL | Status: AC
  Administered 2019-08-20: 650 mg via ORAL
  Filled 2019-08-20: qty 2

## 2019-08-20 MED ORDER — SODIUM CHLORIDE 0.9 % IV SOLN
1.0000 g | INTRAVENOUS | Status: DC
  Administered 2019-08-20 – 2019-08-23 (×4): 1 g via INTRAVENOUS
  Filled 2019-08-20 (×2): qty 1
  Filled 2019-08-20: qty 10
  Filled 2019-08-20: qty 1
  Filled 2019-08-20: qty 10

## 2019-08-20 MED ORDER — SODIUM CHLORIDE 0.9 % IV SOLN
500.0000 mg | INTRAVENOUS | Status: DC
  Administered 2019-08-20 – 2019-08-23 (×4): 500 mg via INTRAVENOUS
  Filled 2019-08-20 (×5): qty 500

## 2019-08-20 MED ORDER — ONDANSETRON HCL 4 MG/2ML IJ SOLN: 4.0000 mg | Freq: Four times a day (QID) | INTRAMUSCULAR | Status: DC | PRN

## 2019-08-20 MED ORDER — ASPIRIN 81 MG PO CHEW
81.0000 mg | CHEWABLE_TABLET | ORAL | Status: DC
  Administered 2019-08-20 – 2019-08-24 (×3): 81 mg via ORAL
  Filled 2019-08-20 (×3): qty 1

## 2019-08-20 MED ORDER — ACETAMINOPHEN 325 MG PO TABS
650.0000 mg | ORAL_TABLET | Freq: Four times a day (QID) | ORAL | Status: DC | PRN
  Administered 2019-08-20 – 2019-08-23 (×5): 650 mg via ORAL
  Filled 2019-08-20 (×5): qty 2

## 2019-08-20 MED ORDER — VANCOMYCIN HCL 1750 MG/350ML IV SOLN
1750.0000 mg | Freq: Once | INTRAVENOUS | Status: AC
  Administered 2019-08-20: 1750 mg via INTRAVENOUS
  Filled 2019-08-20: qty 350

## 2019-08-20 MED ORDER — ONDANSETRON HCL 4 MG PO TABS: 4.0000 mg | ORAL_TABLET | Freq: Four times a day (QID) | ORAL | Status: DC | PRN

## 2019-08-20 MED ORDER — ACETAMINOPHEN 325 MG PO TABS: 650.0000 mg | ORAL_TABLET | Freq: Once | ORAL | Status: DC

## 2019-08-20 NOTE — ED Notes (Signed)
Bladder scan=342 mL

## 2019-08-20 NOTE — ED Notes (Signed)
Per Dr. Maryfrances Bunnell, transfer pt to room 1318 and obtain bladder scans once a shift. Dr. Maryfrances Bunnell aware pts temp and tylenol given.

## 2019-08-20 NOTE — ED Notes (Signed)
Pt wife and pt informed this RN that pt has not voided since 0700, this RN will obtain a bladder scanner to scan pt.

## 2019-08-20 NOTE — H&P (Addendum)
History and Physical  Patient Name: Kristopher Pope     OEU:[id number]    DOB: 1956/02/07    DOA: 08/20/2019 PCP: Marin Olp, MD  Patient coming from: Home  Chief Complaint: Cough, fever, malaise      HPI: Kristopher Pope is a 64 y.o. 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  Patient was in his usual state of health (golfs, is active), until few days ago he started to have malaise, fever, aches.  This progressed to shortness of breath, which developed into severe fatigue until he was too sick to stay at home and came to the ER.  In the ER, temperature 102.5 F, heart rate 80, respirations 20-28.  SPO2 normal on room air.  Chest x-ray showed opacities on the right, and follow-up CT chest showed some residual scarring and likely pneumonia.  Covid test was negative.  He was started on IV antibiotics and the hospitalist service were asked to evaluate.      ROS: Review of Systems  Constitutional: Positive for chills, fever and malaise/fatigue.  Respiratory: Positive for shortness of breath.   All other systems reviewed and are negative.         Past Medical History:  Diagnosis Date  . Chicken pox   . 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 Seizures ([place])    around time of histoplasmosis diagnosis  . 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 CHOLECYSTECTOMY  1995  . left wrist surgery     cyst    Social History: Patient lives with his wife.  The patient walks unassisted.  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  Nonsoker.  Allergies  Allergen Reactions  . Codeine Nausea And Vomiting  . Phenergan [Promethazine Hcl] Nausea And Vomiting    Family  history: family history includes Congenital heart disease in his brother; Diabetes in his father and sister; Hypertension in his father; Osteoarthritis in his mother.  Prior to Admission medications   Medication Sig Start Date End Date Taking? Authorizing Provider  aspirin 81 MG tablet Take 81 mg by mouth every other day.   Yes [provider]  BLACK CURRANT SEED OIL PO Take 1 tablet by mouth daily.   Yes [provider]  ELDERBERRY PO Take 1 capsule by mouth daily.   Yes [provider]  Ginkgo Biloba (GNP GINGKO BILOBA EXTRACT PO) Take 1 tablet by mouth daily.   Yes [provider]  Omega-3 Fatty Acids (FISH OIL) 1000 MG CAPS Take 1,000 mg by mouth daily.   Yes [provider]  Turmeric (QC TUMERIC COMPLEX) 500 MG CAPS Take 500 mg by mouth daily.   Yes [provider]       Physical Exam: BP 117/65 (BP Location: Right Arm)   Pulse 78   Temp (!) 103.1 F (39.5 C) (Oral)   Resp 16   Ht 5\' 11"  (1.803 m)   Wt 80.7 kg   SpO2 98%   BMI 24.83 kg/m  General appearance: Well-developed, adult male, alert and in no acute distress.   Eyes: Anicteric, conjunctiva pink, lids and lashes normal. PERRL.    ENT: No  nasal deformity, discharge, epistaxis.  Hearing normal. OP moist without lesions.   Neck: No neck masses.  Trachea midline.  No thyromegaly/tenderness. Lymph: No cervical or supraclavicular lymphadenopathy. Skin: Warm and dry.  No jaundice.  No suspicious rashes or lesions. Cardiac: RRR, nl S1-S2, no murmurs appreciated.  Capillary refill is brisk.  JVP normal.  No LE edema.  Radial pulses 2+ and symmetric. Respiratory: Tachypneic, diminished on the right.  No wheezing. Abdomen: Abdomen soft.  No TTP or guarding. No ascites, distension, hepatosplenomegaly.   MSK: No deformities or effusions of the large joints of the upper or lower extremities bilaterally.  No cyanosis or clubbing. Neuro: Cranial nerves normal.  Sensation intact to  light touch. Speech is fluent.  Muscle strength normal.    Psych: Sensorium intact and responding to questions, attention normal.  Behavior appropriate.  Affect normal.  Judgment and insight appear normal.     Labs on Admission:  I have personally reviewed following labs and imaging studies: CBC: Recent Labs  Lab 08/20/19 0620  WBC 11.3*  NEUTROABS 9.8*  HGB 13.1  HCT 40.1  MCV 86.4  PLT 230   Basic Metabolic Panel: Recent Labs  Lab 08/20/19 0620  NA 135  K 3.7  CL 103  CO2 23  GLUCOSE 139*  BUN 16  CREATININE 1.18  CALCIUM 9.1   GFR: Estimated Creatinine Clearance: 67.4 mL/min (by C-G formula based on SCr of 1.18 mg/dL).  Liver Function Tests: Recent Labs  Lab 08/20/19 0620  AST 27  ALT 29  ALKPHOS 63  BILITOT 0.9  PROT 8.1  ALBUMIN 4.0     Recent Results (from the past 240 hour(s))  SARS Coronavirus 2 by RT PCR (hospital order, performed in [facility] hospital lab) Nasopharyngeal Nasopharyngeal Swab     Status: None   Collection Time: 08/20/19  6:32 AM   Specimen: Nasopharyngeal Swab  Result Value Ref Range Status   SARS Coronavirus 2 NEGATIVE NEGATIVE Final    Comment: (NOTE) SARS-CoV-2 target nucleic acids are NOT DETECTED. The SARS-CoV-2 RNA is generally detectable in upper and lower respiratory specimens during the acute phase of infection. The lowest concentration of SARS-CoV-2 viral copies this assay can detect is 250 copies / mL. A negative result does not preclude SARS-CoV-2 infection and should not be used as the sole basis for treatment or other patient management decisions.  A negative result may occur with improper specimen collection / handling, submission of specimen other than nasopharyngeal swab, presence of viral mutation(s) within the areas targeted by this assay, and inadequate number of viral copies (<250 copies / mL). A negative result must be combined with clinical observations, patient history, and epidemiological  information. Fact Sheet for Patients:   BoilerBrush.com.cy Fact Sheet for Healthcare Providers: https://pope.com/ This test is not yet approved or cleared  by the [place] FDA and has been authorized for detection and/or diagnosis of SARS-CoV-2 by FDA under an Emergency Use Authorization (EUA).  This EUA will remain in effect (meaning this test can be used) for the duration of the COVID-19 declaration under Section 564(b)(1) of the Act, 21 U.S.C. section 360bbb-3(b)(1), unless the authorization is terminated or revoked sooner. Performed at [facility], [address], [place], [place] [address]            Radiological Exams on Admission: Personally reviewed chest x-ray shows opacities on the right, CT chest shows some groundglass opacities as well.: CT Chest W Contrast  Result Date: 08/20/2019 CLINICAL DATA:  Generalized weakness, fever, chills and mild gist for 2 days. History of COVID infection in November. Abnormal chest radiograph. EXAM: CT CHEST WITH CONTRAST TECHNIQUE: Multidetector CT imaging of the chest was performed during intravenous contrast administration. CONTRAST:  89mL OMNIPAQUE IOHEXOL 300 MG/ML  SOLN COMPARISON:  Chest radiograph from earlier today. FINDINGS: Cardiovascular: Normal heart size. No significant pericardial effusion/thickening. Normal course and caliber of the thoracic aorta. Dilated main pulmonary artery (3.5 cm diameter). Narrowing of the right pulmonary artery adjacent to the calcified adenopathy. No central pulmonary emboli. Chronic SVC occlusion with opacification of numerous ventral chest wall venous collaterals draining to the IVC. Mediastinum/Nodes: No discrete thyroid nodules. Unremarkable esophagus. No axillary adenopathy. Extensive calcified right paratracheal adenopathy. Enlarged non calcified 2.0 cm subcarinal node (series 2/image 80). No pathologically enlarged hilar nodes.  Lungs/Pleura: No pneumothorax. No pleural effusion. Dense patchy consolidation and ground-glass opacity in medial basilar right lower lobe. Moderate diffuse varicoid bronchiectasis, most prominent in the upper lobes. Thick bandlike regions of consolidation with associated volume loss and distortion in the peripheral mid to upper right lung compatible with postinfectious scarring. Mild centrilobular and paraseptal emphysema. Calcified medial left upper lobe granuloma. No lung masses or significant pulmonary nodules. Upper abdomen: Dilated IVC.  Calcified splenic granuloma. Musculoskeletal: No aggressive appearing focal osseous lesions. Moderate thoracic spondylosis. IMPRESSION: 1. Dense patchy consolidation and ground-glass opacity in the medial basilar right lower lobe, most compatible with pneumonia. Nonspecific noncalcified subcarinal adenopathy, potentially reactive. Recommend attention on follow-up chest CT with IV contrast in 3 months. 2. Moderate diffuse varicoid bronchiectasis, most prominent in the upper lobes. Thick bandlike regions of postinfectious scarring in the mid to upper right lung. 3. Chronic SVC occlusion with opacification of numerous ventral chest wall venous collaterals draining to the IVC. Finding probably due to remote granulomatous infection given calcified mediastinal adenopathy and calcified left upper lobe granuloma. 4. Dilated main pulmonary artery, suggesting pulmonary arterial hypertension. 5. Aortic Atherosclerosis (ICD10-I70.0) and Emphysema (ICD10-J43.9). Electronically Signed   By: Delbert Phenix M.D.   On: 08/20/2019 08:46   DG Chest Portable 1 View  Result Date: 08/20/2019 CLINICAL DATA:  Fever, dyspnea EXAM: PORTABLE CHEST 1 VIEW COMPARISON:  None. FINDINGS: No pneumothorax. Blunting of the right costophrenic angle, cannot exclude small right pleural effusion. No left pleural effusion. Volume loss in the right hemithorax. Patchy reticular opacities and distortion throughout  the right lung, most prominent at the right lung apex with associated right pleural thickening. Clear left lung. Mild prominence of the bilateral hila. Mild thickening of the right paratracheal stripe. Top-normal heart size. IMPRESSION: 1. Mild prominence of the bilateral hila and mild thickening of the right paratracheal stripe, cannot exclude adenopathy. Suggest further evaluation with chest CT with IV contrast. Comparison with any prior outside chest imaging would be helpful. 2. Volume loss in the right hemithorax. Patchy reticular opacities and distortion throughout the right lung, most prominent at the right lung apex, with associated right pleural thickening. Findings are most suggestive of chronic postinfectious/postinflammatory scarring. Electronically Signed   By: Delbert Phenix M.D.   On: 08/20/2019 06:24           Assessment/Plan   Community-acquired pneumonia, right lower lobe PSI is only 64, but given his tachypnea still at 28, severe fatigue and shortness of breath, race, and history of histoplasmosis with residual SVC obliteration, I favor observing the patient overnight. -Start ceftriaxone and azithromycin -Start flutter valve and incentive spirometer   Adenopathy and bronchiectasis These are likely related to his  old granulomatous infection/histoplasmosis back in the 90s.  In reviewing the images with Pulm by phone, who were not asked to evaluate the patient, the consensus was he should have repeat CT chest in 3 months and probably pulm follow up at least once here in [place] (he tells me he followed with Pulm in [place] for a time years ago), to have a baseline evaluation of his lung function here. -Pulm referral at discharge -Repeat CT in 3 months of chest     DVT prophylaxis: Lovenox  Code Status: FULL  Family Communication: Wife  Disposition Plan: Anticipate observation ovenright.  If HR and RR stay normal and no need for O2, likely home tomorrow Consults called:  None Admission status: OBS   At the point of initial evaluation, it is my clinical opinion that admission for OBSERVATION is reasonable and necessary because the patient's presenting complaints in the context of their chronic conditions represent sufficient risk of deterioration or significant morbidity to constitute reasonable grounds for close observation in the hospital setting, but that the patient may be medically stable for discharge from the hospital within 24 to 48 hours.    Medical decision making: Patient seen at 11:06 AM on 08/20/2019.  The patient was discussed with Evelena Leyden.  What exists of the patient's chart was reviewed in depth and summarized above.  Clinical condition: stable.        Earl Lites Linnette Panella [facility] Please page though AMION or Epic secure chat:  For password, contact charge nurse

## 2019-08-20 NOTE — ED Provider Notes (Signed)
Received patient as a handoff at shift change from Kristopher Pear, PA-C.  HPI as obtained by handoff provider: [name] is a 64 y.o. male with history of COVID-19 in November 2020, histoplasmosis who presents to the emergency department with a chief complaint of fever.  The patient reports that he developed a fever, chills, body aches, fatigue, nausea, retching, generalized abdominal pain, shortness of breath, and chest tightness 2 days ago.  He denies cough, vomiting, diarrhea, rash, nasal congestion, loss of sense of taste or smell, numbness, weakness, dysuria, hematuria, or headache.  No treatment for symptoms prior to arrival.  The patient reports that he recently traveled with his family to [place], but otherwise has not had any travel outside of the country.  No sick contacts at home.  Patient has not received his COVID-19 vaccination, but he did have COVID-19 in November 2020.  Kristopher Pope was evaluated in Emergency Department on 08/20/2019 for the symptoms described in the history of present illness. He was evaluated in the context of the global COVID-19 pandemic, which necessitated consideration that the patient might be at risk for infection with the SARS-CoV-2 virus that causes COVID-19. Institutional protocols and algorithms that pertain to the evaluation of patients at risk for COVID-19 are in a state of rapid change based on information released by regulatory bodies including the CDC and federal and state organizations. These policies and algorithms were followed during the patient's care in the ED.  Physical Exam  BP 112/73   Pulse 76   Temp (!) 102.1 F (38.9 C) (Oral)   Resp (!) 24   SpO2 95%   Physical Exam  ED Course/Procedures     Procedures Results for orders placed or performed during the hospital encounter of 08/20/19  SARS Coronavirus 2 by RT PCR (hospital order, performed in [facility] hospital lab) Nasopharyngeal Nasopharyngeal  Swab   Specimen: Nasopharyngeal Swab  Result Value Ref Range   SARS Coronavirus 2 NEGATIVE NEGATIVE  CBC with Differential  Result Value Ref Range   WBC 11.3 (H) 4.0 - 10.5 K/uL   RBC 4.64 4.22 - 5.81 MIL/uL   Hemoglobin 13.1 13.0 - 17.0 g/dL   HCT 29.9 37.1 - 69.6 %   MCV 86.4 80.0 - 100.0 fL   MCH 28.2 26.0 - 34.0 pg   MCHC 32.7 30.0 - 36.0 g/dL   RDW 78.9 38.1 - 01.7 %   Platelets 230 150 - 400 K/uL   nRBC 0.0 0.0 - 0.2 %   Neutrophils Relative % 87 %   Neutro Abs 9.8 (H) 1.7 - 7.7 K/uL   Lymphocytes Relative 5 %   Lymphs Abs 0.6 (L) 0.7 - 4.0 K/uL   Monocytes Relative 7 %   Monocytes Absolute 0.8 0.1 - 1.0 K/uL   Eosinophils Relative 0 %   Eosinophils Absolute 0.0 0.0 - 0.5 K/uL   Basophils Relative 0 %   Basophils Absolute 0.0 0.0 - 0.1 K/uL   Immature Granulocytes 1 %   Abs Immature Granulocytes 0.06 0.00 - 0.07 K/uL  Comprehensive metabolic panel  Result Value Ref Range   Sodium 135 135 - 145 mmol/L   Potassium 3.7 3.5 - 5.1 mmol/L   Chloride 103 98 - 111 mmol/L   CO2 23 22 - 32 mmol/L   Glucose, Bld 139 (H) 70 - 99 mg/dL   BUN 16 8 - 23 mg/dL   Creatinine, Ser 5.10 0.61 - 1.24 mg/dL   Calcium 9.1 8.9 - 10.3  mg/dL   Total Protein 8.1 6.5 - 8.1 g/dL   Albumin 4.0 3.5 - 5.0 g/dL   AST 27 15 - 41 U/L   ALT 29 0 - 44 U/L   Alkaline Phosphatase 63 38 - 126 U/L   Total Bilirubin 0.9 0.3 - 1.2 mg/dL   GFR calc non Af Amer >60 >60 mL/min   GFR calc Af Amer >60 >60 mL/min   Anion gap 9 5 - 15   CT Chest W Contrast  Result Date: 08/20/2019 CLINICAL DATA:  Generalized weakness, fever, chills and mild gist for 2 days. History of COVID infection in November. Abnormal chest radiograph. EXAM: CT CHEST WITH CONTRAST TECHNIQUE: Multidetector CT imaging of the chest was performed during intravenous contrast administration. CONTRAST:  51mL OMNIPAQUE IOHEXOL 300 MG/ML  SOLN COMPARISON:  Chest radiograph from earlier today. FINDINGS: Cardiovascular: Normal heart size. No  significant pericardial effusion/thickening. Normal course and caliber of the thoracic aorta. Dilated main pulmonary artery (3.5 cm diameter). Narrowing of the right pulmonary artery adjacent to the calcified adenopathy. No central pulmonary emboli. Chronic SVC occlusion with opacification of numerous ventral chest wall venous collaterals draining to the IVC. Mediastinum/Nodes: No discrete thyroid nodules. Unremarkable esophagus. No axillary adenopathy. Extensive calcified right paratracheal adenopathy. Enlarged non calcified 2.0 cm subcarinal node (series 2/image 80). No pathologically enlarged hilar nodes. Lungs/Pleura: No pneumothorax. No pleural effusion. Dense patchy consolidation and ground-glass opacity in medial basilar right lower lobe. Moderate diffuse varicoid bronchiectasis, most prominent in the upper lobes. Thick bandlike regions of consolidation with associated volume loss and distortion in the peripheral mid to upper right lung compatible with postinfectious scarring. Mild centrilobular and paraseptal emphysema. Calcified medial left upper lobe granuloma. No lung masses or significant pulmonary nodules. Upper abdomen: Dilated IVC.  Calcified splenic granuloma. Musculoskeletal: No aggressive appearing focal osseous lesions. Moderate thoracic spondylosis. IMPRESSION: 1. Dense patchy consolidation and ground-glass opacity in the medial basilar right lower lobe, most compatible with pneumonia. Nonspecific noncalcified subcarinal adenopathy, potentially reactive. Recommend attention on follow-up chest CT with IV contrast in 3 months. 2. Moderate diffuse varicoid bronchiectasis, most prominent in the upper lobes. Thick bandlike regions of postinfectious scarring in the mid to upper right lung. 3. Chronic SVC occlusion with opacification of numerous ventral chest wall venous collaterals draining to the IVC. Finding probably due to remote granulomatous infection given calcified mediastinal adenopathy and  calcified left upper lobe granuloma. 4. Dilated main pulmonary artery, suggesting pulmonary arterial hypertension. 5. Aortic Atherosclerosis (ICD10-I70.0) and Emphysema (ICD10-J43.9). Electronically Signed   By: Ilona Sorrel M.D.   On: 08/20/2019 08:46   DG Chest Portable 1 View  Result Date: 08/20/2019 CLINICAL DATA:  Fever, dyspnea EXAM: PORTABLE CHEST 1 VIEW COMPARISON:  None. FINDINGS: No pneumothorax. Blunting of the right costophrenic angle, cannot exclude small right pleural effusion. No left pleural effusion. Volume loss in the right hemithorax. Patchy reticular opacities and distortion throughout the right lung, most prominent at the right lung apex with associated right pleural thickening. Clear left lung. Mild prominence of the bilateral hila. Mild thickening of the right paratracheal stripe. Top-normal heart size. IMPRESSION: 1. Mild prominence of the bilateral hila and mild thickening of the right paratracheal stripe, cannot exclude adenopathy. Suggest further evaluation with chest CT with IV contrast. Comparison with any prior outside chest imaging would be helpful. 2. Volume loss in the right hemithorax. Patchy reticular opacities and distortion throughout the right lung, most prominent at the right lung apex, with associated right pleural thickening. Findings  are most suggestive of chronic postinfectious/postinflammatory scarring. Electronically Signed   By: Delbert Phenix M.D.   On: 08/20/2019 06:24    MDM   Patient presents to the ED febrile with leukocytosis.  Code Sepsis was not initiated, however blood cultures x2 obtained and empiric antibiotics were started with cefepime and vancomycin.  Plain films obtained of chest were personally reviewed and demonstrate mild prominence of the bilateral stripe.  Adenopathy cannot be excluded and further evaluation with chest CT with contrast was recommended by radiology.  Findings consistent with chronic postinfectious postinflammatory scarring.  EKG  demonstrates NSR and probable old MI.  Given patient's persistent fevers despite administration of 650 mg Tylenol p.o., likely disposition is admission.  On my examination, patient states that yesterday he developed nausea, fevers, generalized body aches, and shortness of breath symptoms.  He also developed a headache.  He was COVID-19 positive in November 2020, however given his collection of symptoms was concern for viral infection.  He denies any cough, orthopnea, recent weight change, edema, or unilateral extremity swelling.  He is still mildly tachypneic, but no significant increased work of breathing.  He is on broad-spectrum antibiotics.  Blood cultures were sent down, however initiation of antibiotics were started prior to second collection.  His fever has improved after administration of first 650 mg Tylenol.  CT demonstrates dense patchy consolidation groundglass opacity involving the medial basilar right lower lobe consistent with pneumonia.  Patient is on appropriate IV antibiotics at this time.  No evidence of pulmonary embolism.  Suspect that this finding is what has caused his collection of symptoms.  Given that he is still tachypneic and was very ill-appearing during initial examination with handoff providers, feel as though their plan for admission for observation and ongoing treatment is reasonable.  Will consult hospitalist.  Spoke with Dr. Maryfrances Bunnell who reviewed today' comprehensive evaluation and feel as though he may be candidate for discharge home with close outpatient support and follow-up. I feel as though this is reasonable as patient is not exhibiting any significant increased WOB.  Fever has improved.  Resting more comfortably.   Spoke with him again and after his evaluation agrees that IV abx and observation is reasonable.        Lorelee New, PA-C 08/20/19 1121    Dione Booze, MD 08/20/19 [phone]

## 2019-08-20 NOTE — ED Notes (Signed)
Temp 99.6 oral

## 2019-08-20 NOTE — Progress Notes (Signed)
A consult was received from an ED provider for vancomycin + cefepime per pharmacy dosing.  The patient's profile has been reviewed for ht/wt/allergies/indication/available labs.   Most recent wt (07/20/19) = 83 kg used for antibiotic dosing.   A one time order has been placed for cefepime 2 g IV once + vancomycin 1750 mg IV once.    Further antibiotics/pharmacy consults should be ordered by admitting physician if indicated.                       Thank you, Cindi Carbon, PharmD 08/20/2019  7:40 AM

## 2019-08-20 NOTE — ED Notes (Signed)
Dr. Maryfrances Bunnell paged regarding patients temp and bladder scan.

## 2019-08-20 NOTE — ED Notes (Signed)
One set of blood culture set to main lab R [facility] @ [phone]

## 2019-08-20 NOTE — ED Notes (Signed)
Temp 99.8 oral

## 2019-08-20 NOTE — Progress Notes (Signed)
Pt instructed on use of Flutter Valve.  Pt demonstrated with good effort and technique.  RT to monitor and assess as needed.  

## 2019-08-20 NOTE — ED Notes (Signed)
Dr. Maryfrances Bunnell spoke to pt via phone before pt was transferred to room 1318.

## 2019-08-20 NOTE — ED Triage Notes (Signed)
Pt from home reports having generalized weakness BODY ACHES FEVER AND CHILLS. Onset 2 days ago. Pt reports being Dx with COVID this past November and this time it feels worse

## 2019-08-20 NOTE — ED Provider Notes (Signed)
[facility]-EMERGENCY DEPT Provider Note   CSN: [id number] Arrival date & time: 08/20/19  0453     History Chief Complaint  Patient presents with  . Influenza    Kristopher Pope is a 64 y.o. male with history of COVID-19 in November 2020, histoplasmosis who presents to the emergency department with a chief complaint of fever.  The patient reports that he developed a fever, chills, body aches, fatigue, nausea, retching, generalized abdominal pain, shortness of breath, and chest tightness 2 days ago.  He denies cough, vomiting, diarrhea, rash, nasal congestion, loss of sense of taste or smell, numbness, weakness, dysuria, hematuria, or headache.  No treatment for symptoms prior to arrival.  The patient reports that he recently traveled with his family to [facility], but otherwise has not had any travel outside of the country.  No sick contacts at home.  Patient has not received his COVID-19 vaccination, but he did have COVID-19 in November 2020.  Kristopher Pope was evaluated in Emergency Department on 08/20/2019 for the symptoms described in the history of present illness. He was evaluated in the context of the global COVID-19 pandemic, which necessitated consideration that the patient might be at risk for infection with the SARS-CoV-2 virus that causes COVID-19. Institutional protocols and algorithms that pertain to the evaluation of patients at risk for COVID-19 are in a state of rapid change based on information released by regulatory bodies including the CDC and federal and state organizations. These policies and algorithms were followed during the patient's care in the ED.   The history is provided by the patient. No language interpreter was used.       Past Medical History:  Diagnosis Date  . Chicken pox   . 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 Seizures (HCC)     around time of histoplasmosis diagnosis  . 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 Hyperlipidemia 07/07/2019  . Superior vena cava syndrome   . History of histoplasmosis     Past Surgical History:  Procedure Laterality Date  . CHOLECYSTECTOMY  1995  . left wrist surgery     cyst       Family History  Problem Relation Age of Onset  . Osteoarthritis Mother   . Hypertension Father   . Diabetes Father   . Diabetes Sister   . Congenital heart disease Brother        passed from this  . Colon cancer Neg Hx   . Colon polyps Neg Hx   . Rectal cancer Neg Hx   . Stomach cancer Neg Hx     Social History   Tobacco Use  . Smoking status: Never Smoker  . Smokeless tobacco: Never Used  Substance Use Topics  . Alcohol use: No    Alcohol/week: 0.0 standard drinks  . Drug use: No    Home Medications Prior to Admission medications   Medication Sig Start Date End Date Taking? Authorizing Provider  aspirin 81 MG tablet Take 81 mg by mouth every other day.   Yes [provider]  BLACK CURRANT SEED OIL PO Take 1 tablet by mouth daily.   Yes [provider]  ELDERBERRY PO Take 1 capsule by mouth daily.   Yes [provider]  Ginkgo Biloba (GNP GINGKO BILOBA EXTRACT PO) Take 1 tablet by mouth daily.   Yes [provider]  Omega-3 Fatty Acids (FISH OIL) 1000 MG CAPS Take 1,000 mg by mouth daily.   Yes [provider]  Turmeric (QC TUMERIC COMPLEX) 500 MG CAPS Take 500 mg by mouth daily.   Yes [provider]    Allergies    Codeine and Phenergan [promethazine hcl]  Review of Systems   Review of Systems  Constitutional: Positive for chills and fever. Negative for appetite change.  HENT: Negative for congestion, sinus pressure, sinus pain and sore throat.   Eyes: Negative for visual disturbance.  Respiratory: Positive for  chest tightness and shortness of breath.   Cardiovascular: Negative for chest pain.  Gastrointestinal: Positive for abdominal pain and nausea. Negative for blood in stool, diarrhea and vomiting.       Retching  Genitourinary: Negative for dysuria, flank pain, frequency, hematuria, penile pain and urgency.  Musculoskeletal: Negative for arthralgias, back pain, gait problem, joint swelling, myalgias, neck pain and neck stiffness.  Skin: Negative for rash.  Allergic/Immunologic: Negative for immunocompromised state.  Neurological: Negative for seizures, syncope, weakness, numbness and headaches.  Psychiatric/Behavioral: Negative for confusion.    Physical Exam Updated Vital Signs BP 106/67   Pulse 72   Temp (!) 102.1 F (38.9 C) (Oral)   Resp (!) 22   SpO2 96%   Physical Exam Vitals and nursing note reviewed.  Constitutional:      General: He is not in acute distress.    Appearance: He is well-developed. He is not ill-appearing or diaphoretic.  HENT:     Head: Normocephalic.     Mouth/Throat:     Mouth: Mucous membranes are moist.  Eyes:     Conjunctiva/sclera: Conjunctivae normal.  Cardiovascular:     Rate and Rhythm: Normal rate and regular rhythm.     Pulses: Normal pulses.     Heart sounds: Normal heart sounds. No murmur. No friction rub. No gallop.   Pulmonary:     Comments: Mild tachypnea, but no retractions or accessory muscle use.  Lungs are slightly diminished bilaterally, but no adventitious breath sounds.  No tenderness palpation of the chest wall. Abdominal:     General: There is no distension.     Palpations: Abdomen is soft. There is no mass.     Tenderness: There is abdominal tenderness. There is no right CVA tenderness, left CVA tenderness, guarding or rebound.     Hernia: No hernia is present.     Comments: Mild generalized tenderness palpation to the abdomen.  No focal tenderness.  Abdomen is soft and nondistended.  Normoactive bowel sounds.  No rebound or  guarding.  Musculoskeletal:     Cervical back: Neck supple.     Right lower leg: No edema.     Left lower leg: No edema.  Skin:    General: Skin is warm and dry.     Coloration: Skin is not jaundiced or pale.     Findings: No erythema.     Comments: Skin is hot to the touch  Neurological:     Mental Status: He is alert.  Psychiatric:        Behavior: Behavior normal.     ED Results / Procedures / Treatments   Labs (all labs ordered are listed, but only abnormal results are displayed) Labs Reviewed  CBC WITH DIFFERENTIAL/PLATELET - Abnormal; Notable for the following components:      Result Value  WBC 11.3 (*)    Neutro Abs 9.8 (*)    Lymphs Abs 0.6 (*)    All other components within normal limits  COMPREHENSIVE METABOLIC PANEL - Abnormal; Notable for the following components:   Glucose, Bld 139 (*)    All other components within normal limits  SARS CORONAVIRUS 2 BY RT PCR (HOSPITAL ORDER, PERFORMED IN [facility])  CULTURE, BLOOD (ROUTINE X 2)  CULTURE, BLOOD (ROUTINE X 2)  URINALYSIS, ROUTINE W REFLEX MICROSCOPIC    EKG None  Radiology DG Chest Portable 1 View  Result Date: 08/20/2019 CLINICAL DATA:  Fever, dyspnea EXAM: PORTABLE CHEST 1 VIEW COMPARISON:  None. FINDINGS: No pneumothorax. Blunting of the right costophrenic angle, cannot exclude small right pleural effusion. No left pleural effusion. Volume loss in the right hemithorax. Patchy reticular opacities and distortion throughout the right lung, most prominent at the right lung apex with associated right pleural thickening. Clear left lung. Mild prominence of the bilateral hila. Mild thickening of the right paratracheal stripe. Top-normal heart size. IMPRESSION: 1. Mild prominence of the bilateral hila and mild thickening of the right paratracheal stripe, cannot exclude adenopathy. Suggest further evaluation with chest CT with IV contrast. Comparison with any prior outside chest imaging would be  helpful. 2. Volume loss in the right hemithorax. Patchy reticular opacities and distortion throughout the right lung, most prominent at the right lung apex, with associated right pleural thickening. Findings are most suggestive of chronic postinfectious/postinflammatory scarring. Electronically Signed   By: Delbert Phenix M.D.   On: 08/20/2019 06:24    Procedures Procedures (including critical care time)  Medications Ordered in ED Medications  vancomycin (VANCOREADY) IVPB 1750 mg/350 mL (1,750 mg Intravenous New Bag/Given 08/20/19 0840)  sodium chloride (PF) 0.9 % injection (has no administration in time range)  acetaminophen (TYLENOL) tablet 650 mg (650 mg Oral Given 08/20/19 0624)  ondansetron (ZOFRAN) injection 4 mg (4 mg Intravenous Given 08/20/19 0624)  ceFEPIme (MAXIPIME) 2 g in sodium chloride 0.9 % 100 mL IVPB (0 g Intravenous Stopped 08/20/19 0835)  iohexol (OMNIPAQUE) 300 MG/ML solution 75 mL (75 mLs Intravenous Contrast Given 08/20/19 0831)    ED Course  I have reviewed the triage vital signs and the nursing notes.  Pertinent labs & imaging results that were available during my care of the patient were reviewed by me and considered in my medical decision making (see chart for details).    MDM Rules/Calculators/A&P                      64 year old male with history of COVID-19 in November 2020, histoplasmosis presenting with fever, body aches, chills, retching, generalized abdominal pain, shortness of breath, and chest tightness for 2 days.  Febrile to 102.5 on arrival.  Leukocytosis of 11.3.  No electrolyte derangements.  Patient does not meet Sepsis criteria at this time.  Blood cultures x2 ordered.  We will start the patient on broad-spectrum antibiotics with cefepime and vancomycin.  COVID-19 test has been ordered.  Chest x-ray with mild prominence of the bilateral hila and mild thickening of the right peritracheal stripe.  Adenopathy cannot be excluded.  Radiology recommends  further evaluation with CT chest.  No previous or outside films available for comparison.  There is also noted to be volume loss in the right hemithorax with patchy reticular opacities and distortion throughout the most lung, most prominent at the right apex that is associated with pleural thickening.  Findings are most suggestive of  chronic postinfectious or post inflammatory scarring.  CT chest has been ordered.  EKG was sinus rhythm and a probable old anteroseptal myocardial infarction.  No prior EKG available for comparison.  CT is pending.  Urinalysis is pending.  Patient is ill-appearing and will likely require admission after ER evaluation is complete.  Patient care transferred to PA [facility] at the end of my shift to follow-up on CT chest from. Patient presentation, ED course, and plan of care discussed with review of all pertinent labs and imaging. Please see his/her note for further details regarding further ED course and disposition.   Final Clinical Impression(s) / ED Diagnoses Final diagnoses:  None    Rx / DC Orders ED Discharge Orders    None       Joanne Gavel, PA-C 16/10/96 0454    Delora Fuel, MD 09/81/19 [phone]

## 2019-08-20 NOTE — ED Notes (Signed)
Pt aware we need urine sample, still unable to void

## 2019-08-20 NOTE — Plan of Care (Signed)
  Problem: Education: Goal: Knowledge of General Education information will improve Description Including pain rating scale, medication(s)/side effects and non-pharmacologic comfort measures Outcome: Progressing   

## 2019-08-21 DIAGNOSIS — J189 Pneumonia, unspecified organism: Secondary | ICD-10-CM | POA: Diagnosis present

## 2019-08-21 DIAGNOSIS — J9601 Acute respiratory failure with hypoxia: Secondary | ICD-10-CM | POA: Diagnosis present

## 2019-08-21 DIAGNOSIS — Z9049 Acquired absence of other specified parts of digestive tract: Secondary | ICD-10-CM | POA: Diagnosis not present

## 2019-08-21 DIAGNOSIS — Z8249 Family history of ischemic heart disease and other diseases of the circulatory system: Secondary | ICD-10-CM | POA: Diagnosis not present

## 2019-08-21 DIAGNOSIS — M7989 Other specified soft tissue disorders: Secondary | ICD-10-CM | POA: Diagnosis not present

## 2019-08-21 DIAGNOSIS — I871 Compression of vein: Secondary | ICD-10-CM | POA: Diagnosis present

## 2019-08-21 DIAGNOSIS — Z8616 Personal history of COVID-19: Secondary | ICD-10-CM | POA: Diagnosis not present

## 2019-08-21 DIAGNOSIS — I808 Phlebitis and thrombophlebitis of other sites: Secondary | ICD-10-CM | POA: Diagnosis not present

## 2019-08-21 DIAGNOSIS — I82211 Chronic embolism and thrombosis of superior vena cava: Secondary | ICD-10-CM | POA: Diagnosis present

## 2019-08-21 DIAGNOSIS — Z888 Allergy status to other drugs, medicaments and biological substances status: Secondary | ICD-10-CM | POA: Diagnosis not present

## 2019-08-21 DIAGNOSIS — Z833 Family history of diabetes mellitus: Secondary | ICD-10-CM | POA: Diagnosis not present

## 2019-08-21 DIAGNOSIS — Z885 Allergy status to narcotic agent status: Secondary | ICD-10-CM | POA: Diagnosis not present

## 2019-08-21 DIAGNOSIS — Z7982 Long term (current) use of aspirin: Secondary | ICD-10-CM | POA: Diagnosis not present

## 2019-08-21 DIAGNOSIS — Z8619 Personal history of other infectious and parasitic diseases: Secondary | ICD-10-CM | POA: Diagnosis not present

## 2019-08-21 DIAGNOSIS — Z23 Encounter for immunization: Secondary | ICD-10-CM | POA: Diagnosis not present

## 2019-08-21 DIAGNOSIS — Z8709 Personal history of other diseases of the respiratory system: Secondary | ICD-10-CM | POA: Diagnosis not present

## 2019-08-21 DIAGNOSIS — J470 Bronchiectasis with acute lower respiratory infection: Secondary | ICD-10-CM | POA: Diagnosis present

## 2019-08-21 DIAGNOSIS — Z79899 Other long term (current) drug therapy: Secondary | ICD-10-CM | POA: Diagnosis not present

## 2019-08-21 DIAGNOSIS — R918 Other nonspecific abnormal finding of lung field: Secondary | ICD-10-CM | POA: Diagnosis present

## 2019-08-21 LAB — CBC
HCT: 39.8 % (ref 39.0–52.0)
Hemoglobin: 12.6 g/dL — ABNORMAL LOW (ref 13.0–17.0)
MCH: 27.6 pg (ref 26.0–34.0)
MCHC: 31.7 g/dL (ref 30.0–36.0)
MCV: 87.3 fL (ref 80.0–100.0)
Platelets: 237 10*3/uL (ref 150–400)
RBC: 4.56 MIL/uL (ref 4.22–5.81)
RDW: 14.3 % (ref 11.5–15.5)
WBC: 9.7 10*3/uL (ref 4.0–10.5)
nRBC: 0 % (ref 0.0–0.2)

## 2019-08-21 LAB — BASIC METABOLIC PANEL
Anion gap: 8 (ref 5–15)
BUN: 16 mg/dL (ref 8–23)
CO2: 22 mmol/L (ref 22–32)
Calcium: 8.5 mg/dL — ABNORMAL LOW (ref 8.9–10.3)
Chloride: 105 mmol/L (ref 98–111)
Creatinine, Ser: 1.21 mg/dL (ref 0.61–1.24)
GFR calc Af Amer: >60 mL/min (ref >60)
GFR calc non Af Amer: >60 mL/min (ref >60)
Glucose, Bld: 125 mg/dL — ABNORMAL HIGH (ref 70–99)
Potassium: 3.8 mmol/L (ref 3.5–5.1)
Sodium: 135 mmol/L (ref 135–145)

## 2019-08-21 LAB — HIV ANTIBODY (ROUTINE TESTING W REFLEX): HIV Screen 4th Generation wRfx: NONREACTIVE

## 2019-08-21 MED ORDER — ALBUTEROL SULFATE (2.5 MG/3ML) 0.083% IN NEBU
2.5000 mg | INHALATION_SOLUTION | Freq: Four times a day (QID) | RESPIRATORY_TRACT | Status: DC
  Administered 2019-08-21: 2.5 mg via RESPIRATORY_TRACT
  Filled 2019-08-21: qty 3

## 2019-08-21 MED ORDER — IPRATROPIUM-ALBUTEROL 0.5-2.5 (3) MG/3ML IN SOLN
3.0000 mL | Freq: Two times a day (BID) | RESPIRATORY_TRACT | Status: DC
  Administered 2019-08-21 – 2019-08-24 (×6): 3 mL via RESPIRATORY_TRACT
  Filled 2019-08-21 (×6): qty 3

## 2019-08-21 MED ORDER — IBUPROFEN 200 MG PO TABS
200.0000 mg | ORAL_TABLET | Freq: Four times a day (QID) | ORAL | Status: DC | PRN
  Administered 2019-08-22: 200 mg via ORAL
  Filled 2019-08-21: qty 1

## 2019-08-21 NOTE — Progress Notes (Signed)
Sp02 on 1 lpm 95%- placed on RA post neb treatment- asked RN to recheck Sp02 in approximately 15 mins.

## 2019-08-21 NOTE — Progress Notes (Signed)
PROGRESS NOTE    Kristopher Pope  PFX:[id number] DOB: 1955-09-13 DOA: 08/20/2019 PCP: Shelva Majestic, MD  Brief Narrative: HPI per Dr. Wonda Horner Kristopher Pope is a 64 y.o. 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  Patient was in his usual state of health (golfs, is active), until few days ago he started to have malaise, fever, aches.  This progressed to shortness of breath, which developed into severe fatigue until he was too sick to stay at home and came to the ER.  In the ER, temperature 102.5 F, heart rate 80, respirations 20-28.  SPO2 normal on room air.  Chest x-ray showed opacities on the right, and follow-up CT chest showed some residual scarring and likely pneumonia.  Covid test was negative.  He was started on IV antibiotics and the hospitalist service were asked to evaluate.  Assessment & Plan:   Active Problems:   Superior vena cava syndrome   History of histoplasmosis   Community acquired pneumonia of right lower lobe of lung    #1 acute hypoxic respiratory failure secondary to right lower lobe pneumonia community-acquired-patient remains tachypneic with shortness of breath and dyspnea on exertion and febrile. Will continue Rocephin and azithromycin nebulizer treatments and incentive spirometry.  #2 adenopathy with bronchiectasis with history of histoplasmosis in the 1990s.  Admitting physician discussed with PCCM recommended to do a CT of the chest in 3 months.  He will also need to follow-up with the pulmonologist after discharge.  Please give him a pulmonary referral upon discharge.   Estimated body mass index is 24.83 kg/m as calculated from the following:   Height as of this encounter: 5\' 11"  (1.803 m).   Weight as of this encounter: 80.7 kg.  DVT prophylaxis: Lovenox Code Status: Full code  family Communication: Discussed with wife Disposition  Plan:  Status is: Inpatient  Dispo: The patient is from: Home              Anticipated d/c is to: Home              Anticipated d/c date is 1 to 2 days              Patient currently is not medically stable to d/c.  Patient admitted with hypoxic respiratory failure due to pneumonia remains tachypneic and febrile on IV antibiotics.    Consultants: Admitting physician discussed with PCCM on the phone    Procedures: None  Antimicrobials:  Anti-infectives (From admission, onward)   Start     Dose/Rate Route Frequency Ordered Stop   08/20/19 1600  cefTRIAXone (ROCEPHIN) 1 g in sodium chloride 0.9 % 100 mL IVPB     1 g 200 mL/hr over 30 Minutes Intravenous Every 24 hours 08/20/19 1114     08/20/19 1200  azithromycin (ZITHROMAX) 500 mg in sodium chloride 0.9 % 250 mL IVPB     500 mg 250 mL/hr over 60 Minutes Intravenous Every 24 hours 08/20/19 1114     08/20/19 0800  vancomycin (VANCOREADY) IVPB 1750 mg/350 mL     1,750 mg 175 mL/hr over 120 Minutes Intravenous  Once 08/20/19 0739 08/20/19 1100   08/20/19 0745  ceFEPIme (MAXIPIME) 2 g in sodium chloride 0.9 % 100 mL IVPB     2 g 200 mL/hr over 30 Minutes Intravenous STAT 08/20/19 0738 08/20/19 0835       Subjective:  Patient  resting in bed, became tachypneic with activity with increasing shortness of breath T-max 100.3 Objective: Vitals:   08/20/19 2233 08/21/19 0157 08/21/19 0618 08/21/19 1050  BP: 109/68 112/65 113/65 121/71  Pulse: 69 75 61 69  Resp: 18 17 17  (!) 23  Temp: 98.7 F (37.1 C) 98.9 F (37.2 C)  100.3 F (37.9 C)  TempSrc: Oral Oral  Oral  SpO2:  94% 97%   Weight:      Height:        Intake/Output Summary (Last 24 hours) at 08/21/2019 1118 Last data filed at 08/21/2019 1010 Gross per 24 hour  Intake 890 ml  Output 25 ml  Net 865 ml   Filed Weights   08/20/19 1123  Weight: 80.7 kg    Examination:  General exam: Appears calm and comfortable  Respiratory system: Few crackles at the right base to  auscultation. Respiratory effort normal. Cardiovascular system: S1 & S2 heard, RRR. No JVD, murmurs, rubs, gallops or clicks. No pedal edema. Gastrointestinal system: Abdomen is nondistended, soft and nontender. No organomegaly or masses felt. Normal bowel sounds heard. Central nervous system: Alert and oriented. No focal neurological deficits. Extremities: Symmetric 5 x 5 power. Skin: No rashes, lesions or ulcers Psychiatry: Judgement and insight appear normal. Mood & affect appropriate.     Data Reviewed: I have personally reviewed following labs and imaging studies  CBC: Recent Labs  Lab 08/20/19 0620 08/21/19 0440  WBC 11.3* 9.7  NEUTROABS 9.8*  --   HGB 13.1 12.6*  HCT 40.1 39.8  MCV 86.4 87.3  PLT 230 237   Basic Metabolic Panel: Recent Labs  Lab 08/20/19 0620 08/21/19 0440  NA 135 135  K 3.7 3.8  CL 103 105  CO2 23 22  GLUCOSE 139* 125*  BUN 16 16  CREATININE 1.18 1.21  CALCIUM 9.1 8.5*   GFR: Estimated Creatinine Clearance: 65.7 mL/min (by C-G formula based on SCr of 1.21 mg/dL). Liver Function Tests: Recent Labs  Lab 08/20/19 0620  AST 27  ALT 29  ALKPHOS 63  BILITOT 0.9  PROT 8.1  ALBUMIN 4.0   No results for input(s): LIPASE, AMYLASE in the last 168 hours. No results for input(s): AMMONIA in the last 168 hours. Coagulation Profile: No results for input(s): INR, PROTIME in the last 168 hours. Cardiac Enzymes: No results for input(s): CKTOTAL, CKMB, CKMBINDEX, TROPONINI in the last 168 hours. BNP (last 3 results) No results for input(s): PROBNP in the last 8760 hours. HbA1C: No results for input(s): HGBA1C in the last 72 hours. CBG: No results for input(s): GLUCAP in the last 168 hours. Lipid Profile: No results for input(s): CHOL, HDL, LDLCALC, TRIG, CHOLHDL, LDLDIRECT in the last 72 hours. Thyroid Function Tests: No results for input(s): TSH, T4TOTAL, FREET4, T3FREE, THYROIDAB in the last 72 hours. Anemia Panel: No results for input(s):  VITAMINB12, FOLATE, FERRITIN, TIBC, IRON, RETICCTPCT in the last 72 hours. Sepsis Labs: No results for input(s): PROCALCITON, LATICACIDVEN in the last 168 hours.  Recent Results (from the past 240 hour(s))  SARS Coronavirus 2 by RT PCR (hospital order, performed in [facility] hospital lab) Nasopharyngeal Nasopharyngeal Swab     Status: None   Collection Time: 08/20/19  6:32 AM   Specimen: Nasopharyngeal Swab  Result Value Ref Range Status   SARS Coronavirus 2 NEGATIVE NEGATIVE Final    Comment: (NOTE) SARS-CoV-2 target nucleic acids are NOT DETECTED. The SARS-CoV-2 RNA is generally detectable in upper and lower respiratory specimens during the acute phase  of infection. The lowest concentration of SARS-CoV-2 viral copies this assay can detect is 250 copies / mL. A negative result does not preclude SARS-CoV-2 infection and should not be used as the sole basis for treatment or other patient management decisions.  A negative result may occur with improper specimen collection / handling, submission of specimen other than nasopharyngeal swab, presence of viral mutation(s) within the areas targeted by this assay, and inadequate number of viral copies (<250 copies / mL). A negative result must be combined with clinical observations, patient history, and epidemiological information. Fact Sheet for Patients:   StrictlyIdeas.no Fact Sheet for Healthcare Providers: BankingDealers.co.za This test is not yet approved or cleared  by the [place] FDA and has been authorized for detection and/or diagnosis of SARS-CoV-2 by FDA under an Emergency Use Authorization (EUA).  This EUA will remain in effect (meaning this test can be used) for the duration of the COVID-19 declaration under Section 564(b)(1) of the Act, 21 U.S.C. section 360bbb-3(b)(1), unless the authorization is terminated or revoked sooner. Performed at [facility],  [place] [address] [place], [place] [address]   Blood culture (routine x 2)     Status: None (Preliminary result)   Collection Time: 08/20/19  9:00 AM   Specimen: BLOOD  Result Value Ref Range Status   Specimen Description   Final    BLOOD RIGHT ANTECUBITAL Performed at [facility] [address] [place], [place] [address]    Special Requests   Final    BOTTLES DRAWN AEROBIC AND ANAEROBIC Blood Culture results may not be optimal due to an excessive volume of blood received in culture bottles Performed at [facility] [address], [place], [place] [address]    Culture   Final    NO GROWTH < 24 HOURS Performed at [facility] [address], [place],  [address]    Report Status PENDING  Incomplete         Radiology Studies: CT Chest W Contrast  Result Date: 08/20/2019 CLINICAL DATA:  Generalized weakness, fever, chills and mild gist for 2 days. History of COVID infection in November. Abnormal chest radiograph. EXAM: CT CHEST WITH CONTRAST TECHNIQUE: Multidetector CT imaging of the chest was performed during intravenous contrast administration. CONTRAST:  46mL OMNIPAQUE IOHEXOL 300 MG/ML  SOLN COMPARISON:  Chest radiograph from earlier today. FINDINGS: Cardiovascular: Normal heart size. No significant pericardial effusion/thickening. Normal course and caliber of the thoracic aorta. Dilated main pulmonary artery (3.5 cm diameter). Narrowing of the right pulmonary artery adjacent to the calcified adenopathy. No central pulmonary emboli. Chronic SVC occlusion with opacification of numerous ventral chest wall venous collaterals draining to the IVC. Mediastinum/Nodes: No discrete thyroid nodules. Unremarkable esophagus. No axillary adenopathy. Extensive calcified right paratracheal adenopathy. Enlarged non calcified 2.0 cm subcarinal node (series 2/image 80). No pathologically enlarged hilar nodes. Lungs/Pleura: No pneumothorax. No pleural  effusion. Dense patchy consolidation and ground-glass opacity in medial basilar right lower lobe. Moderate diffuse varicoid bronchiectasis, most prominent in the upper lobes. Thick bandlike regions of consolidation with associated volume loss and distortion in the peripheral mid to upper right lung compatible with postinfectious scarring. Mild centrilobular and paraseptal emphysema. Calcified medial left upper lobe granuloma. No lung masses or significant pulmonary nodules. Upper abdomen: Dilated IVC.  Calcified splenic granuloma. Musculoskeletal: No aggressive appearing focal osseous lesions. Moderate thoracic spondylosis. IMPRESSION: 1. Dense patchy consolidation and ground-glass opacity in the medial basilar right lower lobe, most compatible with  pneumonia. Nonspecific noncalcified subcarinal adenopathy, potentially reactive. Recommend attention on follow-up chest CT with IV contrast in 3 months. 2. Moderate diffuse varicoid bronchiectasis, most prominent in the upper lobes. Thick bandlike regions of postinfectious scarring in the mid to upper right lung. 3. Chronic SVC occlusion with opacification of numerous ventral chest wall venous collaterals draining to the IVC. Finding probably due to remote granulomatous infection given calcified mediastinal adenopathy and calcified left upper lobe granuloma. 4. Dilated main pulmonary artery, suggesting pulmonary arterial hypertension. 5. Aortic Atherosclerosis (ICD10-I70.0) and Emphysema (ICD10-J43.9). Electronically Signed   By: Delbert Phenix M.D.   On: 08/20/2019 08:46   DG Chest Portable 1 View  Result Date: 08/20/2019 CLINICAL DATA:  Fever, dyspnea EXAM: PORTABLE CHEST 1 VIEW COMPARISON:  None. FINDINGS: No pneumothorax. Blunting of the right costophrenic angle, cannot exclude small right pleural effusion. No left pleural effusion. Volume loss in the right hemithorax. Patchy reticular opacities and distortion throughout the right lung, most prominent at the right  lung apex with associated right pleural thickening. Clear left lung. Mild prominence of the bilateral hila. Mild thickening of the right paratracheal stripe. Top-normal heart size. IMPRESSION: 1. Mild prominence of the bilateral hila and mild thickening of the right paratracheal stripe, cannot exclude adenopathy. Suggest further evaluation with chest CT with IV contrast. Comparison with any prior outside chest imaging would be helpful. 2. Volume loss in the right hemithorax. Patchy reticular opacities and distortion throughout the right lung, most prominent at the right lung apex, with associated right pleural thickening. Findings are most suggestive of chronic postinfectious/postinflammatory scarring. Electronically Signed   By: Delbert Phenix M.D.   On: 08/20/2019 06:24        Scheduled Meds: . aspirin  81 mg Oral QODAY  . enoxaparin (LOVENOX) injection  40 mg Subcutaneous Q24H  . pneumococcal 23 valent vaccine  0.5 mL Intramuscular Tomorrow-1000   Continuous Infusions: . azithromycin Stopped (08/20/19 1558)  . cefTRIAXone (ROCEPHIN)  IV 1 g (08/20/19 1941)     LOS: 0 days     Alwyn Ren, MD  08/21/2019, 11:18 AM

## 2019-08-22 NOTE — Progress Notes (Signed)
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 Having malaise fever and aches at home and developed severe fatigue and was too sick to stay at home and came to the ED where he was tachypneic febrile 102.5 saturating on room air chest x-ray with opacities on the right and follow-up CT chest showed residual scarring and likely pneumonia COVID-19 was negative, initiated on IV antibiotics and admitted.  Subjective: Patient reports he got winded while ambulating in the room and suction dropped to 89%. At rest he saturating in mid 90s. Denies chest pain nausea vomiting. Assessment & Plan:  Acute hypoxic respiratory failure due to right lower lobe pneumonia: Off oxygen while at rest and dropped to 10% walking and also symptomatic with shortness of breath. Continue on incentive spirometry and pulmonary toileting. Has been doing incentive spirometer around-the-clock with reminder in the phone.  Community-acquired pneumonia patient with fever and dyspnea positive chest x-ray stat CT scan also discarding, bronchiectasis. Continue ceftriaxone and azithromycin. Incentive spirometry.  Adenopathy with bronchiectasis with history of histoplasmosis in the 1990s at medication discussed with PCCM and recommended CT chest in 3 months will need follow-up with pulmonology at discharge.   DVT prophylaxis:SCD/Heparin/lovenox/DOAC/Coumadin Code Status: FULL  Family Communication: plan of care discussed with patient at bedside.  Status is: Inpatient  Remains inpatient appropriate because:IV treatments appropriate due to intensity of illness or inability to take PO, Inpatient level of care appropriate due to severity of illness and For ongoing  management of shortness of breath with activity, hypoxia with activity and treatment of pneumonia with IV antibiotics   Dispo: The patient is from: Home              Anticipated d/c is to: Home              Anticipated d/c date is: 1 day              Patient currently is not medically stable to d/c. Continue IV antibiotics for next 24 hours and anticipate discharge tomorrow if able to ambulate w/o oxygen  Diet Order            Diet regular Room service appropriate? Yes; Fluid consistency: Thin  Diet effective now              Body mass index is 24.83 kg/m.  Consultants:see note  Procedures:see note Microbiology:see note  Medications: Scheduled Meds: . aspirin  81 mg Oral QODAY  . enoxaparin (LOVENOX) injection  40 mg Subcutaneous Q24H  . ipratropium-albuterol  3 mL Nebulization BID   Continuous Infusions: . azithromycin 500 mg (08/22/19 1222)  . cefTRIAXone (ROCEPHIN)  IV 1 g (08/21/19 2012)   Antimicrobials: Anti-infectives (From admission, onward)   Start     Dose/Rate Route Frequency Ordered Stop   08/20/19 1600  cefTRIAXone (ROCEPHIN) 1 g in sodium chloride 0.9 % 100 mL IVPB     1 g 200 mL/hr over 30 Minutes Intravenous Every 24 hours 08/20/19 1114     08/20/19 1200  azithromycin (ZITHROMAX) 500 mg in sodium chloride 0.9 % 250 mL IVPB     500 mg 250 mL/hr over 60 Minutes Intravenous Every 24 hours 08/20/19 1114     08/20/19 0800  vancomycin (VANCOREADY) IVPB 1750 mg/350  mL     1,750 mg 175 mL/hr over 120 Minutes Intravenous  Once 08/20/19 0739 08/20/19 1100   08/20/19 0745  ceFEPIme (MAXIPIME) 2 g in sodium chloride 0.9 % 100 mL IVPB     2 g 200 mL/hr over 30 Minutes Intravenous STAT 08/20/19 0738 08/20/19 0835     Objective: Vitals: Today's Vitals   08/21/19 2159 08/22/19 0501 08/22/19 0936 08/22/19 1043  BP: 116/66 135/86    Pulse: 87 77    Resp:  18    Temp:  98.5 F (36.9 C)    TempSrc:  Oral    SpO2: 94% 98%    Weight:      Height:      PainSc:    2  1     Intake/Output Summary (Last 24 hours) at 08/22/2019 1434 Last data filed at 08/22/2019 1000 Gross per 24 hour  Intake 417 ml  Output 0 ml  Net 417 ml   Filed Weights   08/20/19 1123  Weight: 80.7 kg   Weight change:    Intake/Output from previous day: 05/30 0701 - 05/31 0700 In: 240 [P.O.:240] Out: -  Intake/Output this shift: Total I/O In: 237 [P.O.:237] Out: 0   Examination:  General exam: AAOx3 ,NAD, weak appearing. HEENT:Oral mucosa moist, Ear/Nose WNL grossly,dentition normal. Respiratory system: bilaterally basal crackles with more on the right lung base,no wheezing or crackles,no use of accessory muscle, non tender. Cardiovascular system: S1 & S2 +, regular, No JVD. Gastrointestinal system: Abdomen soft, NT,ND, BS+. Nervous System:Alert, awake, moving extremities and grossly nonfocal Extremities: No edema, distal peripheral pulses palpable.  Skin: No rashes,no icterus. MSK: Normal muscle bulk,tone, power  Data Reviewed: I have personally reviewed following labs and imaging studies CBC: Recent Labs  Lab 08/20/19 0620 08/21/19 0440  WBC 11.3* 9.7  NEUTROABS 9.8*  --   HGB 13.1 12.6*  HCT 40.1 39.8  MCV 86.4 87.3  PLT 230 237   Basic Metabolic Panel: Recent Labs  Lab 08/20/19 0620 08/21/19 0440  NA 135 135  K 3.7 3.8  CL 103 105  CO2 23 22  GLUCOSE 139* 125*  BUN 16 16  CREATININE 1.18 1.21  CALCIUM 9.1 8.5*   GFR: Estimated Creatinine Clearance: 65.7 mL/min (by C-G formula based on SCr of 1.21 mg/dL). Liver Function Tests: Recent Labs  Lab 08/20/19 0620  AST 27  ALT 29  ALKPHOS 63  BILITOT 0.9  PROT 8.1  ALBUMIN 4.0   No results for input(s): LIPASE, AMYLASE in the last 168 hours. No results for input(s): AMMONIA in the last 168 hours. Coagulation Profile: No results for input(s): INR, PROTIME in the last 168 hours. Cardiac Enzymes: No results for input(s): CKTOTAL, CKMB, CKMBINDEX, TROPONINI in the last 168 hours. BNP  (last 3 results) No results for input(s): PROBNP in the last 8760 hours. HbA1C: No results for input(s): HGBA1C in the last 72 hours. CBG: No results for input(s): GLUCAP in the last 168 hours. Lipid Profile: No results for input(s): CHOL, HDL, LDLCALC, TRIG, CHOLHDL, LDLDIRECT in the last 72 hours. Thyroid Function Tests: No results for input(s): TSH, T4TOTAL, FREET4, T3FREE, THYROIDAB in the last 72 hours. Anemia Panel: No results for input(s): VITAMINB12, FOLATE, FERRITIN, TIBC, IRON, RETICCTPCT in the last 72 hours. Sepsis Labs: No results for input(s): PROCALCITON, LATICACIDVEN in the last 168 hours.  Recent Results (from the past 240 hour(s))  SARS Coronavirus 2 by RT PCR (hospital order, performed in [facility] hospital lab) Nasopharyngeal Nasopharyngeal Swab  Status: None   Collection Time: 08/20/19  6:32 AM   Specimen: Nasopharyngeal Swab  Result Value Ref Range Status   SARS Coronavirus 2 NEGATIVE NEGATIVE Final    Comment: (NOTE) SARS-CoV-2 target nucleic acids are NOT DETECTED. The SARS-CoV-2 RNA is generally detectable in upper and lower respiratory specimens during the acute phase of infection. The lowest concentration of SARS-CoV-2 viral copies this assay can detect is 250 copies / mL. A negative result does not preclude SARS-CoV-2 infection and should not be used as the sole basis for treatment or other patient management decisions.  A negative result may occur with improper specimen collection / handling, submission of specimen other than nasopharyngeal swab, presence of viral mutation(s) within the areas targeted by this assay, and inadequate number of viral copies (<250 copies / mL). A negative result must be combined with clinical observations, patient history, and epidemiological information. Fact Sheet for Patients:   StrictlyIdeas.no Fact Sheet for Healthcare Providers: BankingDealers.co.za This test is not  yet approved or cleared  by the [place] FDA and has been authorized for detection and/or diagnosis of SARS-CoV-2 by FDA under an Emergency Use Authorization (EUA).  This EUA will remain in effect (meaning this test can be used) for the duration of the COVID-19 declaration under Section 564(b)(1) of the Act, 21 U.S.C. section 360bbb-3(b)(1), unless the authorization is terminated or revoked sooner. Performed at [facility], [place] [address] [place], [place] [address]   Blood culture (routine x 2)     Status: None (Preliminary result)   Collection Time: 08/20/19  9:00 AM   Specimen: BLOOD  Result Value Ref Range Status   Specimen Description   Final    BLOOD RIGHT ANTECUBITAL Performed at [place] [address], [place], [place] [address]    Special Requests   Final    BOTTLES DRAWN AEROBIC AND ANAEROBIC Blood Culture results may not be optimal due to an excessive volume of blood received in culture bottles Performed at [place] [address], [place], [place] [address]    Culture   Final    NO GROWTH 2 DAYS Performed at [facility] [address], [place], [place] [address]    Report Status PENDING  Incomplete      Radiology Studies: No results found.   LOS: 1 day   Antonieta Pert, MD [facility]  08/22/2019, 2:34 PM

## 2019-08-22 NOTE — Progress Notes (Signed)
Patient sat's 95% on room air. When ambulating in the room it dropped down to 89-92% on room air.

## 2019-08-23 ENCOUNTER — Inpatient Hospital Stay (HOSPITAL_COMMUNITY): Payer: BC Managed Care – PPO

## 2019-08-23 DIAGNOSIS — M7989 Other specified soft tissue disorders: Secondary | ICD-10-CM

## 2019-08-23 DIAGNOSIS — J189 Pneumonia, unspecified organism: Secondary | ICD-10-CM | POA: Diagnosis not present

## 2019-08-23 NOTE — Progress Notes (Signed)
Upper venous duplex       has been completed. Preliminary results can be found under CV proc through chart review. Fatema Rabe, BS, RDMS, RVT   

## 2019-08-23 NOTE — Progress Notes (Signed)
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 Having malaise fever and aches at home and developed severe fatigue and was too sick to stay at home and came to the ED where he was tachypneic febrile 102.5 saturating on room air chest x-ray with opacities on the right and follow-up CT chest showed residual scarring and likely pneumonia COVID-19 was negative, initiated on IV antibiotics and admitted.  Subjective: He had Fever spike 101.1 this am He feels more energy today no shortness of breath. Wife at the bedside.  Assessment & Plan:  Acute Hypoxic respiratory failure due to pneumonia : Patient is able to come off oxygen.  Doing well today. Monitor closely.  Continue bronchodilators incentive spirometry.    Community-acquired pneumonia : Continue ceftriaxone and erythromycin and incentive spirometry bronchodilators.  Had fever this morning continue IV antibiotics for next 24 hours and reassess for conversion to p.o. and discharge home.  He had CT scan admission that showed dense patchy consolidation and groundglass opacity in the medial basilar right lower lobe nonspecific noncalcified subcarinal adenopathy potentially reactive and recommended CT chest with IV contrast in 3 months.  Patient was informed of this finding and he will need pulmonary follow-up upon discharge.   Adenopathy with bronchiectasis with history of histoplasmosis in the 1990s at medication discussed with PCCM and recommended CT chest in 3 months will need follow-up with pulmonology at discharge.  Left upper extremity swelling patient reports he had IV in the ED.  Checking duplex to rule out DVT.  Chronic SVC occlusion with opacification of numerous  ventral chest wall venous collaterals down to the IVC likely due to nongranulomatous infection.  Patient reports she was not anticoagulated point for that currently on aspirin low-dose.   DVT prophylaxis:lovenox Code Status: FULL  Family Communication: plan of care discussed with patient and his wife at bedside.  Status is: Inpatient  Remains inpatient appropriate because:IV treatments appropriate due to intensity of illness or inability to take PO, Inpatient level of care appropriate due to severity of illness and For ongoing management of shortness of breath with activity, hypoxia with activity and treatment of pneumonia with IV antibiotics keep on IV antibiotics  due to recurrent fever   Dispo: The patient is from: Home              Anticipated d/c is to: Home              Anticipated d/c date is: 1 day              Patient currently is not medically stable to d/c. Continue IV antibiotics for next 24 hours and anticipate discharge tomorrow if no more fever spike.  Diet Order            Diet regular Room service appropriate? Yes; Fluid consistency: Thin  Diet effective now              Body mass index is 24.83 kg/m.  Consultants:see note  Procedures:see note Microbiology:see note  Medications: Scheduled Meds:  aspirin  81 mg Oral QODAY   enoxaparin (LOVENOX) injection  40 mg Subcutaneous Q24H   ipratropium-albuterol  3 mL Nebulization BID   Continuous Infusions:  azithromycin 500 mg (08/23/19 1146)   cefTRIAXone (ROCEPHIN)  IV 1 g (08/22/19 1720)   Antimicrobials: Anti-infectives (From admission, onward)   Start     Dose/Rate Route Frequency Ordered Stop   08/20/19 1600  cefTRIAXone (ROCEPHIN) 1 g in sodium chloride 0.9 % 100 mL IVPB     1 g 200 mL/hr over 30 Minutes Intravenous Every 24 hours 08/20/19 1114     08/20/19 1200  azithromycin (ZITHROMAX) 500 mg in sodium chloride 0.9 % 250 mL IVPB     500 mg 250 mL/hr over 60 Minutes Intravenous Every 24 hours  08/20/19 1114     08/20/19 0800  vancomycin (VANCOREADY) IVPB 1750 mg/350 mL     1,750 mg 175 mL/hr over 120 Minutes Intravenous  Once 08/20/19 0739 08/20/19 1100   08/20/19 0745  ceFEPIme (MAXIPIME) 2 g in sodium chloride 0.9 % 100 mL IVPB     2 g 200 mL/hr over 30 Minutes Intravenous STAT 08/20/19 0738 08/20/19 0835     Objective: Vitals: Today's Vitals   08/23/19 0627 08/23/19 0735 08/23/19 0800 08/23/19 1430  BP:    118/68  Pulse:    68  Resp:    18  Temp: 98.9 F (37.2 C)   98.6 F (37 C)  TempSrc: Oral   Oral  SpO2:  96%  98%  Weight:      Height:      PainSc:   0-No pain     Intake/Output Summary (Last 24 hours) at 08/23/2019 1453 Last data filed at 08/23/2019 1400 Gross per 24 hour  Intake 1030 ml  Output 0 ml  Net 1030 ml   Filed Weights   08/20/19 1123  Weight: 80.7 kg   Weight change:    Intake/Output from previous day: 05/31 0701 - 06/01 0700 In: 777 [P.O.:777] Out: 0  Intake/Output this shift: Total I/O In: 730 [P.O.:480; IV Piggyback:250] Out: 0   Examination:  General exam: AAOx3 , NAD, weak appearing. HEENT:Oral mucosa moist, Ear/Nose WNL grossly, dentition normal. Respiratory system: Right basal crackles present, no wheezing, no use of accessory muscle Cardiovascular system: S1 & S2 +, No JVD,. Gastrointestinal system: Abdomen soft, NT,ND, BS+ Nervous System:Alert, awake, moving extremities and grossly nonfocal Extremities: No edema, distal peripheral pulses palpable.  Skin: No rashes,no icterus. MSK: Normal muscle bulk,tone, power  Data Reviewed: I have personally reviewed following labs and imaging studies CBC: Recent Labs  Lab 08/20/19 0620 08/21/19 0440  WBC 11.3* 9.7  NEUTROABS 9.8*  --   HGB 13.1 12.6*  HCT 40.1 39.8  MCV 86.4 87.3  PLT 230 175   Basic Metabolic Panel: Recent Labs  Lab 08/20/19 0620 08/21/19 0440  NA 135 135  K 3.7 3.8  CL 103 105  CO2 23 22  GLUCOSE 139* 125*  BUN 16 16  CREATININE 1.18 1.21    CALCIUM 9.1 8.5*   GFR: Estimated Creatinine Clearance: 65.7 mL/min (by C-G formula based on SCr of 1.21 mg/dL). Liver Function Tests: Recent Labs  Lab 08/20/19 0620  AST 27  ALT 29  ALKPHOS 63  BILITOT 0.9  PROT 8.1  ALBUMIN 4.0   No results for input(s): LIPASE, AMYLASE in the last 168 hours. No results for input(s): AMMONIA in the last 168 hours. Coagulation Profile: No results for input(s): INR, PROTIME in the last 168 hours. Cardiac Enzymes: No results for input(s): CKTOTAL, CKMB, CKMBINDEX, TROPONINI in the last 168 hours. BNP (last 3 results) No results for input(s): PROBNP in the last 8760 hours. HbA1C: No results for input(s): HGBA1C in the  last 72 hours. CBG: No results for input(s): GLUCAP in the last 168 hours. Lipid Profile: No results for input(s): CHOL, HDL, LDLCALC, TRIG, CHOLHDL, LDLDIRECT in the last 72 hours. Thyroid Function Tests: No results for input(s): TSH, T4TOTAL, FREET4, T3FREE, THYROIDAB in the last 72 hours. Anemia Panel: No results for input(s): VITAMINB12, FOLATE, FERRITIN, TIBC, IRON, RETICCTPCT in the last 72 hours. Sepsis Labs: No results for input(s): PROCALCITON, LATICACIDVEN in the last 168 hours.  Recent Results (from the past 240 hour(s))  SARS Coronavirus 2 by RT PCR (hospital order, performed in [facility] hospital lab) Nasopharyngeal Nasopharyngeal Swab     Status: None   Collection Time: 08/20/19  6:32 AM   Specimen: Nasopharyngeal Swab  Result Value Ref Range Status   SARS Coronavirus 2 NEGATIVE NEGATIVE Final    Comment: (NOTE) SARS-CoV-2 target nucleic acids are NOT DETECTED. The SARS-CoV-2 RNA is generally detectable in upper and lower respiratory specimens during the acute phase of infection. The lowest concentration of SARS-CoV-2 viral copies this assay can detect is 250 copies / mL. A negative result does not preclude SARS-CoV-2 infection and should not be used as the sole basis for treatment or other patient  management decisions.  A negative result may occur with improper specimen collection / handling, submission of specimen other than nasopharyngeal swab, presence of viral mutation(s) within the areas targeted by this assay, and inadequate number of viral copies (<250 copies / mL). A negative result must be combined with clinical observations, patient history, and epidemiological information. Fact Sheet for Patients:   BoilerBrush.com.cy Fact Sheet for Healthcare Providers: https://pope.com/ This test is not yet approved or cleared  by the [place] FDA and has been authorized for detection and/or diagnosis of SARS-CoV-2 by FDA under an Emergency Use Authorization (EUA).  This EUA will remain in effect (meaning this test can be used) for the duration of the COVID-19 declaration under Section 564(b)(1) of the Act, 21 U.S.C. section 360bbb-3(b)(1), unless the authorization is terminated or revoked sooner. Performed at [facility], [address] [place], [place] [address]   Blood culture (routine x 2)     Status: None (Preliminary result)   Collection Time: 08/20/19  9:00 AM   Specimen: BLOOD  Result Value Ref Range Status   Specimen Description   Final    BLOOD RIGHT ANTECUBITAL Performed at [facility], [address], [place], [place] [address]    Special Requests   Final    BOTTLES DRAWN AEROBIC AND ANAEROBIC Blood Culture results may not be optimal due to an excessive volume of blood received in culture bottles Performed at [facility], [address] [place], [place] [address]    Culture   Final    NO GROWTH 3 DAYS Performed at [facility], [address] [place], [place] [address]    Report Status PENDING  Incomplete      Radiology Studies: No results found.   LOS: 2 days   Lanae Boast, MD [facility]  08/23/2019, 2:53 PM

## 2019-08-24 ENCOUNTER — Encounter: Payer: BC Managed Care – PPO | Admitting: Physical Therapy

## 2019-08-24 LAB — BASIC METABOLIC PANEL
Anion gap: 9 (ref 5–15)
BUN: 15 mg/dL (ref 8–23)
CO2: 24 mmol/L (ref 22–32)
Calcium: 9 mg/dL (ref 8.9–10.3)
Chloride: 109 mmol/L (ref 98–111)
Creatinine, Ser: 1.01 mg/dL (ref 0.61–1.24)
GFR calc Af Amer: >60 mL/min (ref >60)
GFR calc non Af Amer: >60 mL/min (ref >60)
Glucose, Bld: 113 mg/dL — ABNORMAL HIGH (ref 70–99)
Potassium: 3.7 mmol/L (ref 3.5–5.1)
Sodium: 142 mmol/L (ref 135–145)

## 2019-08-24 MED ORDER — AMOXICILLIN-POT CLAVULANATE 875-125 MG PO TABS: 1.0000 | ORAL_TABLET | Freq: Two times a day (BID) | ORAL | 0 refills | Status: AC

## 2019-08-24 MED ORDER — ASPIRIN EC 325 MG PO TBEC: 325.0000 mg | DELAYED_RELEASE_TABLET | Freq: Every day | ORAL | 3 refills | Status: AC

## 2019-08-24 MED ORDER — SACCHAROMYCES BOULARDII 250 MG PO CAPS
250.0000 mg | ORAL_CAPSULE | Freq: Two times a day (BID) | ORAL | Status: DC
  Administered 2019-08-24: 250 mg via ORAL
  Filled 2019-08-24: qty 1

## 2019-08-24 MED ORDER — SACCHAROMYCES BOULARDII 250 MG PO CAPS: 250.0000 mg | ORAL_CAPSULE | Freq: Two times a day (BID) | ORAL | 0 refills | Status: DC

## 2019-08-24 NOTE — Progress Notes (Signed)
Assessment unchanged. Pt verbalized understanding of dc instructions through teach back including medications, follow up care and when to call the doctor. Discharged via wc to front entrance accompanied by NT. 

## 2019-08-24 NOTE — Discharge Summary (Signed)
Physician Discharge Summary  Kristopher Pope ZWC:[id number] DOB: 12-13-1955 DOA: 08/20/2019  PCP: Marin Olp, MD  Admit date: 08/20/2019 Discharge date: 08/24/2019  Admitted From: Home Disposition: Home Recommendations for Outpatient Follow-up:  1. Follow up with PCP in 1-2 weeks 2. Please obtain BMP/CBC in one week 3. Follow-up with pulmonologist for history of histoplasmosis and abnormal CT of the chest Home Health: None Equipment/Devices: None Discharge Condition: Stable CODE STATUS: Full code Diet recommendation: Cardiac diet Brief/Interim Summary:64 year old male with history of Covid last November and history of histoplasmosis in 1990s presented with fever and shortness of breath x3 days. Having malaise fever and aches at home and developed severe fatigue and was too sick to stay at home and came to the ED where he was tachypneic febrile 102.5 saturating on room air chest x-ray with opacities on the right and follow-up CT chest showed residual scarring and likely pneumonia COVID-19 was negative, initiated on IV antibiotics and admitted.   Discharge Diagnoses:  Active Problems:   Superior vena cava syndrome   History of histoplasmosis   Community acquired pneumonia of right lower lobe of lung   CAP (community acquired pneumonia)  Acute Hypoxic respiratory failure due to community-acquired pneumonia.  He was treated with Rocephin and azithromycin and oxygen.  He was able to be weaned off oxygen 2 days prior to discharge.  He was afebrile for 24 hours prior to discharge.  His white count was normal.  He will be discharged on Augmentin for 3 more days to finish a course of 1 week.    He had CT scan admission that showed dense patchy consolidation and groundglass opacity in the medial basilar right lower lobe nonspecific noncalcified subcarinal adenopathy potentially reactive and recommended CT chest with IV contrast in 3 months.  Patient was informed of this finding and he  will need pulmonary follow-up upon discharge.   Adenopathy with bronchiectasis with history of histoplasmosis in the 1990s.  Admitting physician discussed with PCCM who recommended a CT chest in 3 months.  Patient is aware of this.   Left upper extremity swelling patient reports he had IV in the ED. venous Doppler showed left upper extremity superficial thrombophlebitis.  Advised the patient to take aspirin 325 daily for 1 month.  Advised him to stop taking black seed oil while he is on aspirin 325 mg daily.  Black seed oil access a blood thinner.  Chronic SVC occlusion with opacification of numerous ventral chest wall venous collaterals down to the IVC likely due to nongranulomatous infection.    Continue aspirin.    Estimated body mass index is 24.83 kg/m as calculated from the following:   Height as of this encounter: 5\' 11"  (1.803 m).   Weight as of this encounter: 80.7 kg.  Discharge Instructions  Discharge Instructions    Ambulatory referral to Pulmonology   Complete by: As directed    Reason for referral: Interstitial Lung Disease(ILD)   Diet - low sodium heart healthy   Complete by: As directed    Diet - low sodium heart healthy   Complete by: As directed    Increase activity slowly   Complete by: As directed    Increase activity slowly   Complete by: As directed      Allergies as of 08/24/2019      Reactions   Codeine Nausea And Vomiting   Phenergan [promethazine Hcl] Nausea And Vomiting      Medication List    STOP taking these medications  aspirin 81 MG tablet Replaced by: aspirin EC 325 MG tablet   BLACK CURRANT SEED OIL PO     TAKE these medications   amoxicillin-clavulanate 875-125 MG tablet Commonly known as: Augmentin Take 1 tablet by mouth 2 (two) times daily for 3 days.   aspirin EC 325 MG tablet Take 1 tablet (325 mg total) by mouth daily. Take one tab daily for 30 days then resume 81 mg daily Replaces: aspirin 81 MG tablet   ELDERBERRY  PO Take 1 capsule by mouth daily.   Fish Oil 1000 MG Caps Take 1,000 mg by mouth daily.   GNP GINGKO BILOBA EXTRACT PO Take 1 tablet by mouth daily.   QC Tumeric Complex 500 MG Caps Generic drug: Turmeric Take 500 mg by mouth daily.   saccharomyces boulardii 250 MG capsule Commonly known as: FLORASTOR Take 1 capsule (250 mg total) by mouth 2 (two) times daily.      Follow-up Information    Shelva Majestic, MD Follow up.   Specialty: Family Medicine Contact information: [address] [place] [address] [phone]        Kalman Shan, MD Follow up.   Specialty: Pulmonary Disease Contact information: [address] [place] [address] [phone]          Allergies  Allergen Reactions  . Codeine Nausea And Vomiting  . Phenergan [Promethazine Hcl] Nausea And Vomiting    Consultations:  None   Procedures/Studies: CT Chest W Contrast  Result Date: 08/20/2019 CLINICAL DATA:  Generalized weakness, fever, chills and mild gist for 2 days. History of COVID infection in November. Abnormal chest radiograph. EXAM: CT CHEST WITH CONTRAST TECHNIQUE: Multidetector CT imaging of the chest was performed during intravenous contrast administration. CONTRAST:  17mL OMNIPAQUE IOHEXOL 300 MG/ML  SOLN COMPARISON:  Chest radiograph from earlier today. FINDINGS: Cardiovascular: Normal heart size. No significant pericardial effusion/thickening. Normal course and caliber of the thoracic aorta. Dilated main pulmonary artery (3.5 cm diameter). Narrowing of the right pulmonary artery adjacent to the calcified adenopathy. No central pulmonary emboli. Chronic SVC occlusion with opacification of numerous ventral chest wall venous collaterals draining to the IVC. Mediastinum/Nodes: No discrete thyroid nodules. Unremarkable esophagus. No axillary adenopathy. Extensive calcified right paratracheal adenopathy. Enlarged non calcified 2.0 cm subcarinal node (series  2/image 80). No pathologically enlarged hilar nodes. Lungs/Pleura: No pneumothorax. No pleural effusion. Dense patchy consolidation and ground-glass opacity in medial basilar right lower lobe. Moderate diffuse varicoid bronchiectasis, most prominent in the upper lobes. Thick bandlike regions of consolidation with associated volume loss and distortion in the peripheral mid to upper right lung compatible with postinfectious scarring. Mild centrilobular and paraseptal emphysema. Calcified medial left upper lobe granuloma. No lung masses or significant pulmonary nodules. Upper abdomen: Dilated IVC.  Calcified splenic granuloma. Musculoskeletal: No aggressive appearing focal osseous lesions. Moderate thoracic spondylosis. IMPRESSION: 1. Dense patchy consolidation and ground-glass opacity in the medial basilar right lower lobe, most compatible with pneumonia. Nonspecific noncalcified subcarinal adenopathy, potentially reactive. Recommend attention on follow-up chest CT with IV contrast in 3 months. 2. Moderate diffuse varicoid bronchiectasis, most prominent in the upper lobes. Thick bandlike regions of postinfectious scarring in the mid to upper right lung. 3. Chronic SVC occlusion with opacification of numerous ventral chest wall venous collaterals draining to the IVC. Finding probably due to remote granulomatous infection given calcified mediastinal adenopathy and calcified left upper lobe granuloma. 4. Dilated main pulmonary artery, suggesting pulmonary arterial hypertension. 5. Aortic Atherosclerosis (ICD10-I70.0) and Emphysema (  ICD10-J43.9). Electronically Signed   By: Delbert Phenix M.D.   On: 08/20/2019 08:46   DG Chest Portable 1 View  Result Date: 08/20/2019 CLINICAL DATA:  Fever, dyspnea EXAM: PORTABLE CHEST 1 VIEW COMPARISON:  None. FINDINGS: No pneumothorax. Blunting of the right costophrenic angle, cannot exclude small right pleural effusion. No left pleural effusion. Volume loss in the right hemithorax.  Patchy reticular opacities and distortion throughout the right lung, most prominent at the right lung apex with associated right pleural thickening. Clear left lung. Mild prominence of the bilateral hila. Mild thickening of the right paratracheal stripe. Top-normal heart size. IMPRESSION: 1. Mild prominence of the bilateral hila and mild thickening of the right paratracheal stripe, cannot exclude adenopathy. Suggest further evaluation with chest CT with IV contrast. Comparison with any prior outside chest imaging would be helpful. 2. Volume loss in the right hemithorax. Patchy reticular opacities and distortion throughout the right lung, most prominent at the right lung apex, with associated right pleural thickening. Findings are most suggestive of chronic postinfectious/postinflammatory scarring. Electronically Signed   By: Delbert Phenix M.D.   On: 08/20/2019 06:24   VAS [place] UPPER EXTREMITY VENOUS DUPLEX  Result Date: 08/23/2019 UPPER VENOUS STUDY  Indications: Swelling, and Erythema Performing Technologist: Jeb Levering RDMS, RVT  Examination Guidelines: A complete evaluation includes B-mode imaging, spectral Doppler, color Doppler, and power Doppler as needed of all accessible portions of each vessel. Bilateral testing is considered an integral part of a complete examination. Limited examinations for reoccurring indications may be performed as noted.  Right Findings: +-----+------------+---------+-----------+----------+--------------+ RIGHTCompressiblePhasicitySpontaneousProperties   Summary     +-----+------------+---------+-----------+----------+--------------+ IJV                                            Not visualized +-----+------------+---------+-----------+----------+--------------+  Left Findings: +----------+------------+---------+-----------+----------+--------------+ LEFT      CompressiblePhasicitySpontaneousProperties   Summary      +----------+------------+---------+-----------+----------+--------------+ IJV                                                 Not visualized +----------+------------+---------+-----------+----------+--------------+ Subclavian    Full       Yes       Yes                             +----------+------------+---------+-----------+----------+--------------+ Axillary      Full       Yes       Yes                             +----------+------------+---------+-----------+----------+--------------+ Brachial      Full                                                 +----------+------------+---------+-----------+----------+--------------+ Radial        Full                                                 +----------+------------+---------+-----------+----------+--------------+  Ulnar         Full                                                 +----------+------------+---------+-----------+----------+--------------+ Cephalic    Partial                                     Acute      +----------+------------+---------+-----------+----------+--------------+ Basilic       Full                                                 +----------+------------+---------+-----------+----------+--------------+  Summary:  Left: No evidence of deep vein thrombosis in the upper extremity. Findings consistent with acute superficial vein thrombosis involving the left cephalic vein.  *See table(s) above for measurements and observations.  Diagnosing physician: Waverly Ferrari MD Electronically signed by Waverly Ferrari MD on 08/23/2019 at 6:05:12 PM.    Final     (Echo, Carotid, EGD, Colonoscopy, ERCP)    Subjective:  Patient resting in bed awake and alert and anxious to go home decreased swelling of the left upper extremity Discharge Exam: Vitals:   08/24/19 0538 08/24/19 0748  BP: 138/85   Pulse: 67   Resp: 16   Temp: 98.5 F (36.9 C)   SpO2: 99% 94%   Vitals:    08/23/19 1935 08/23/19 2138 08/24/19 0538 08/24/19 0748  BP:  131/78 138/85   Pulse:  79 67   Resp:  16 16   Temp:  98.6 F (37 C) 98.5 F (36.9 C)   TempSrc:  Oral Oral   SpO2: 97% 96% 99% 94%  Weight:      Height:        General: Pt is alert, awake, not in acute distress Cardiovascular: RRR, S1/S2 +, no rubs, no gallops Respiratory: CTA bilaterally, no wheezing, no rhonchi Abdominal: Soft, NT, ND, bowel sounds + Extremities: Left upper extremity with mild swelling and tenderness   The results of significant diagnostics from this hospitalization (including imaging, microbiology, ancillary and laboratory) are listed below for reference.     Microbiology: Recent Results (from the past 240 hour(s))  SARS Coronavirus 2 by RT PCR (hospital order, performed in [facility] hospital lab) Nasopharyngeal Nasopharyngeal Swab     Status: None   Collection Time: 08/20/19  6:32 AM   Specimen: Nasopharyngeal Swab  Result Value Ref Range Status   SARS Coronavirus 2 NEGATIVE NEGATIVE Final    Comment: (NOTE) SARS-CoV-2 target nucleic acids are NOT DETECTED. The SARS-CoV-2 RNA is generally detectable in upper and lower respiratory specimens during the acute phase of infection. The lowest concentration of SARS-CoV-2 viral copies this assay can detect is 250 copies / mL. A negative result does not preclude SARS-CoV-2 infection and should not be used as the sole basis for treatment or other patient management decisions.  A negative result may occur with improper specimen collection / handling, submission of specimen other than nasopharyngeal swab, presence of viral mutation(s) within the areas targeted by this assay, and inadequate number of viral copies (<250 copies / mL). A negative result must be combined with clinical observations, patient history,  and epidemiological information. Fact Sheet for Patients:   BoilerBrush.com.cy Fact Sheet for Healthcare  Providers: https://pope.com/ This test is not yet approved or cleared  by the [place] FDA and has been authorized for detection and/or diagnosis of SARS-CoV-2 by FDA under an Emergency Use Authorization (EUA).  This EUA will remain in effect (meaning this test can be used) for the duration of the COVID-19 declaration under Section 564(b)(1) of the Act, 21 U.S.C. section 360bbb-3(b)(1), unless the authorization is terminated or revoked sooner. Performed at [facility], [address], [place], [place] [address]   Blood culture (routine x 2)     Status: None (Preliminary result)   Collection Time: 08/20/19  9:00 AM   Specimen: BLOOD  Result Value Ref Range Status   Specimen Description   Final    BLOOD RIGHT ANTECUBITAL Performed at [facility], [address], [place], [place] [address]    Special Requests   Final    BOTTLES DRAWN AEROBIC AND ANAEROBIC Blood Culture results may not be optimal due to an excessive volume of blood received in culture bottles Performed at [facility], [address], [place], [place] [address]    Culture   Final    NO GROWTH 4 DAYS Performed at [facility], [address], [place], [place] [address]    Report Status PENDING  Incomplete     Labs: BNP (last 3 results) No results for input(s): BNP in the last 8760 hours. Basic Metabolic Panel: Recent Labs  Lab 08/20/19 0620 08/21/19 0440 08/24/19 0444  NA 135 135 142  K 3.7 3.8 3.7  CL 103 105 109  CO2 23 22 24   GLUCOSE 139* 125* 113*  BUN 16 16 15   CREATININE 1.18 1.21 1.01  CALCIUM 9.1 8.5* 9.0   Liver Function Tests: Recent Labs  Lab 08/20/19 0620  AST 27  ALT 29  ALKPHOS 63  BILITOT 0.9  PROT 8.1  ALBUMIN 4.0   No results for input(s): LIPASE, AMYLASE in the last 168 hours. No results for input(s): AMMONIA in the last 168 hours. CBC: Recent Labs  Lab 08/20/19 0620  08/21/19 0440  WBC 11.3* 9.7  NEUTROABS 9.8*  --   HGB 13.1 12.6*  HCT 40.1 39.8  MCV 86.4 87.3  PLT 230 237   Cardiac Enzymes: No results for input(s): CKTOTAL, CKMB, CKMBINDEX, TROPONINI in the last 168 hours. BNP: Invalid input(s): POCBNP CBG: No results for input(s): GLUCAP in the last 168 hours. D-Dimer No results for input(s): DDIMER in the last 72 hours. Hgb A1c No results for input(s): HGBA1C in the last 72 hours. Lipid Profile No results for input(s): CHOL, HDL, LDLCALC, TRIG, CHOLHDL, LDLDIRECT in the last 72 hours. Thyroid function studies No results for input(s): TSH, T4TOTAL, T3FREE, THYROIDAB in the last 72 hours.  Invalid input(s): FREET3 Anemia work up No results for input(s): VITAMINB12, FOLATE, FERRITIN, TIBC, IRON, RETICCTPCT in the last 72 hours. Urinalysis No results found for: COLORURINE, APPEARANCEUR, LABSPEC, PHURINE, GLUCOSEU, HGBUR, BILIRUBINUR, KETONESUR, PROTEINUR, UROBILINOGEN, NITRITE, LEUKOCYTESUR Sepsis Labs Invalid input(s): PROCALCITONIN,  WBC,  LACTICIDVEN Microbiology Recent Results (from the past 240 hour(s))  SARS Coronavirus 2 by RT PCR (hospital order, performed in [facility] hospital lab) Nasopharyngeal Nasopharyngeal Swab     Status: None   Collection Time: 08/20/19  6:32 AM   Specimen: Nasopharyngeal Swab  Result Value Ref Range Status   SARS Coronavirus 2 NEGATIVE NEGATIVE Final    Comment: (  NOTE) SARS-CoV-2 target nucleic acids are NOT DETECTED. The SARS-CoV-2 RNA is generally detectable in upper and lower respiratory specimens during the acute phase of infection. The lowest concentration of SARS-CoV-2 viral copies this assay can detect is 250 copies / mL. A negative result does not preclude SARS-CoV-2 infection and should not be used as the sole basis for treatment or other patient management decisions.  A negative result may occur with improper specimen collection / handling, submission of specimen other than  nasopharyngeal swab, presence of viral mutation(s) within the areas targeted by this assay, and inadequate number of viral copies (<250 copies / mL). A negative result must be combined with clinical observations, patient history, and epidemiological information. Fact Sheet for Patients:   BoilerBrush.com.cyhttps://www.fda.gov/media/136312/download Fact Sheet for Healthcare Providers: https://pope.com/https://www.fda.gov/media/136313/download This test is not yet approved or cleared  by the [place] FDA and has been authorized for detection and/or diagnosis of SARS-CoV-2 by FDA under an Emergency Use Authorization (EUA).  This EUA will remain in effect (meaning this test can be used) for the duration of the COVID-19 declaration under Section 564(b)(1) of the Act, 21 U.S.C. section 360bbb-3(b)(1), unless the authorization is terminated or revoked sooner. Performed at [facility], [address] [place], [place] [address]   Blood culture (routine x 2)     Status: None (Preliminary result)   Collection Time: 08/20/19  9:00 AM   Specimen: BLOOD  Result Value Ref Range Status   Specimen Description   Final    BLOOD RIGHT ANTECUBITAL Performed at [facility], [address] [place], [place] [address]    Special Requests   Final    BOTTLES DRAWN AEROBIC AND ANAEROBIC Blood Culture results may not be optimal due to an excessive volume of blood received in culture bottles Performed at [facility], [address] [place], [place] [address]    Culture   Final    NO GROWTH 4 DAYS Performed at [facility], [address] [place], [place] [address]    Report Status PENDING  Incomplete     Time coordinating discharge:  39 minutes  SIGNED:   Alwyn RenElizabeth G Maryruth Apple, MD  [facility] 08/24/2019, 8:47 AM  If 7PM-7AM, please contact night-coverage [email or web address] Password [email or web address]

## 2019-08-25 ENCOUNTER — Telehealth: Payer: Self-pay

## 2019-08-25 LAB — CULTURE, BLOOD (ROUTINE X 2): Culture: NO GROWTH

## 2019-08-25 NOTE — Telephone Encounter (Signed)
Tried to call patient to do TCM call. Not able to reach please call to make hospital follow. When you get him let me know he is on the phone so I can do TCM.

## 2019-08-25 NOTE — Telephone Encounter (Signed)
Patient called yesterday to make an appt he is scheduled for 09/02/19

## 2019-08-26 ENCOUNTER — Encounter: Payer: Self-pay | Admitting: Physical Therapy

## 2019-08-26 ENCOUNTER — Ambulatory Visit: Payer: BC Managed Care – PPO | Attending: Family Medicine | Admitting: Physical Therapy

## 2019-08-26 ENCOUNTER — Telehealth: Payer: Self-pay

## 2019-08-26 ENCOUNTER — Other Ambulatory Visit: Payer: Self-pay

## 2019-08-26 DIAGNOSIS — M545 Low back pain, unspecified: Secondary | ICD-10-CM

## 2019-08-26 DIAGNOSIS — M6281 Muscle weakness (generalized): Secondary | ICD-10-CM | POA: Diagnosis not present

## 2019-08-26 NOTE — Therapy (Signed)
[place], [place], [address] Phone: [phone]   Fax:  [phone]  Physical Therapy Treatment  Patient Details  Name: Kristopher Pope MRN: [id number] Date of Birth: [birth year] Referring Provider (PT): Garret Reddish   Encounter Date: 08/26/2019  PT End of Session - 08/26/19 0926    Visit Number  3    Date for PT Re-Evaluation  10/03/19    PT Start Time  0845    PT Stop Time  0929    PT Time Calculation (min)  44 min    Activity Tolerance  Patient tolerated treatment well    Behavior During Therapy  [facility] for tasks assessed/performed       Past Medical History:  Diagnosis Date  . Chicken pox   . 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 Seizures ([place])    around time of histoplasmosis diagnosis  . 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 CHOLECYSTECTOMY  1995  . left wrist surgery     cyst    There were no vitals filed for this visit.  Subjective Assessment - 08/26/19 0845    Subjective  Pt reports he was in the hospital with community acquired pneumonia (neg COVID testing) for four days. Pt reports he is feeling better but still a little short of breath with exercise.    Currently in Pain?  No/denies    Pain Score  0-No pain    Pain Location  Back                        [facility] Adult PT Treatment/Exercise - 08/26/19 0001      Lumbar Exercises: Aerobic   Nustep  L5 x 10 min      Lumbar Exercises: Machines for Strengthening   Cybex Knee Extension  10# 2x15    Cybex Knee Flexion  25# 2x15    Other Lumbar Machine Exercise  25# 2x15 rows and lats      Lumbar Exercises: Standing   Other Standing Lumbar Exercises  lumbar ext over exercise ball with 5 sec hold x10    Other Standing  Lumbar Exercises  mini squat with exercise ball x10 sec hold x10 reps      Lumbar Exercises: Seated   Other Seated Lumbar Exercises  seated fwd/lat flexion with exercise ball x5 sec hold               PT Short Term Goals - 08/19/19 0924      PT SHORT TERM GOAL #1   Title  Pt will be independent with HEP    Status  Partially Met        PT Long Term Goals - 08/19/19 0924      PT LONG TERM GOAL #3   Title  Pt will reduce reports of LBP by 50%    Status  Partially Met            Plan - 08/26/19 0926    Clinical Impression Statement  Pt reports to clinic after hospitalization d/t pneumonia (neg COVID test). O2 levels throughout rx ranged from 94%-98% with recovery after <1 min of rest throughout exercise. Pt required frequent rest breaks for recovery. Continue to progress core strengthening  and LE strengthening.    PT Treatment/Interventions  ADLs/Self Care Home Management;Electrical Stimulation;Moist Heat;Iontophoresis 58m/ml Dexamethasone;Gait training;Stair training;Functional mobility training;Therapeutic activities;Therapeutic exercise;Balance training;Neuromuscular re-education;Manual techniques;Patient/family education;Passive range of motion;Dry needling    PT Next Visit Plan  Progress LE and lumbar strengthening/flexibility, manual/modalities as indicated    Consulted and Agree with Plan of Care  Patient       Patient will benefit from skilled therapeutic intervention in order to improve the following deficits and impairments:  Abnormal gait, Decreased range of motion, Difficulty walking, Decreased endurance, Increased muscle spasms, Decreased activity tolerance, Pain, Hypomobility, Impaired flexibility, Decreased strength, Decreased mobility  Visit Diagnosis: Muscle weakness (generalized)  Acute bilateral low back pain without sciatica     Problem List Patient Active Problem List   Diagnosis Date Noted  . CAP (community acquired pneumonia) 08/21/2019  .  Community acquired pneumonia of right lower lobe of lung 08/20/2019  . Hyperlipidemia 07/07/2019  . Superior vena cava syndrome   . History of histoplasmosis    AAmador Cunas PT, DPT ADonald ProseSugg 08/26/2019, 9:33 AM  [place] [address]Phone: [phone]  Fax:  [phone] Name: SErnan RunklesMuslim MRN: [id number] of Birth: [birth year]

## 2019-08-26 NOTE — Telephone Encounter (Signed)
Transition Care Management Follow-up Telephone Call  Date of discharge and from where: [facility] 08/24/2019  How have you been since you were released from the hospital? [facility], just needing to wlak around more  Any questions or concerns? No   Items Reviewed:  Did the pt receive and understand the discharge instructions provided? Yes   Medications obtained and verified? Yes   Any new allergies since your discharge? No   Dietary orders reviewed? Yes  Do you have support at home? Yes   Other (ie: DME, Home Health, etc) n/a  Functional Questionnaire: (I = Independent and D = Dependent) ADL's: I  Bathing/Dressing- I   Meal Prep- I  Eating- I  Maintaining continence- I  Transferring/Ambulation- I  Managing Meds- i   Follow up appointments reviewed:    PCP Hospital f/u appt confirmed? Yes  Scheduled to see Dr. Durene Cal  on 09/02/2019 @ 3:40p..  Are transportation arrangements needed? No   If their condition worsens, is the pt aware to call  their PCP or go to the ED? Yes  Was the patient provided with contact information for the PCP's office or ED? Yes  Was the pt encouraged to call back with questions or concerns? Yes

## 2019-08-31 ENCOUNTER — Encounter: Payer: Self-pay | Admitting: Physical Therapy

## 2019-08-31 ENCOUNTER — Ambulatory Visit: Payer: BC Managed Care – PPO | Admitting: Physical Therapy

## 2019-08-31 ENCOUNTER — Other Ambulatory Visit: Payer: Self-pay

## 2019-08-31 DIAGNOSIS — M6281 Muscle weakness (generalized): Secondary | ICD-10-CM

## 2019-08-31 DIAGNOSIS — M545 Low back pain, unspecified: Secondary | ICD-10-CM

## 2019-08-31 NOTE — Therapy (Signed)
[place], [place], [address] Phone: [phone]   Fax:  [phone]  Physical Therapy Treatment  Patient Details  Name: Kristopher Pope MRN: [id number] Date of Birth: 06/03/1955 Referring Provider (PT): Garret Reddish   Encounter Date: 08/31/2019  PT End of Session - 08/31/19 1613    Visit Number  4    Date for PT Re-Evaluation  10/03/19    PT Start Time  1528    PT Stop Time  9892    PT Time Calculation (min)  46 min    Activity Tolerance  Patient tolerated treatment well    Behavior During Therapy  [facility] for tasks assessed/performed       Past Medical History:  Diagnosis Date  . Chicken pox   . 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 Seizures ([place])    around time of histoplasmosis diagnosis  . 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 CHOLECYSTECTOMY  1995  . left wrist surgery     cyst    There were no vitals filed for this visit.  Subjective Assessment - 08/31/19 1528    Subjective  Pt reports he is feeling short of breath today; still recovering from pneumonia.    Pain Score  0-No pain    Pain Location  Back                        [facility] Adult PT Treatment/Exercise - 08/31/19 0001      Lumbar Exercises: Aerobic   Nustep  L5 x 8 min      Lumbar Exercises: Machines for Strengthening   Cybex Lumbar Extension  2x15 black TB    Cybex Knee Extension  10# 2x15    Cybex Knee Flexion  25# 2x15    Other Lumbar Machine Exercise  25# 2x15 rows and lats    Other Lumbar Machine Exercise  shoulder ext 10# 2x15, AR press 20# 1x15 B               PT Short Term Goals - 08/19/19 1194      PT SHORT TERM GOAL #1   Title  Pt will be independent with HEP    Status  Partially Met         PT Long Term Goals - 08/19/19 0924      PT LONG TERM GOAL #3   Title  Pt will reduce reports of LBP by 50%    Status  Partially Met            Plan - 08/31/19 1614    Clinical Impression Statement  Pt progressing well with TE; no complaints of increased LBP with exercise today. O2 ranged from 90-98% with recovery after <1 min of pursed lip breathing. Required frequent rest breaks throughout rx.    PT Treatment/Interventions  ADLs/Self Care Home Management;Electrical Stimulation;Moist Heat;Iontophoresis 105m/ml Dexamethasone;Gait training;Stair training;Functional mobility training;Therapeutic activities;Therapeutic exercise;Balance training;Neuromuscular re-education;Manual techniques;Patient/family education;Passive range of motion;Dry needling    PT Next Visit Plan  Progress LE and lumbar strengthening/flexibility, manual/modalities as indicated    Consulted and Agree with Plan of Care  Patient       Patient will benefit from skilled therapeutic intervention in order to improve the  following deficits and impairments:  Abnormal gait, Decreased range of motion, Difficulty walking, Decreased endurance, Increased muscle spasms, Decreased activity tolerance, Pain, Hypomobility, Impaired flexibility, Decreased strength, Decreased mobility  Visit Diagnosis: Muscle weakness (generalized)  Acute bilateral low back pain without sciatica     Problem List Patient Active Problem List   Diagnosis Date Noted  . CAP (community acquired pneumonia) 08/21/2019  . Community acquired pneumonia of right lower lobe of lung 08/20/2019  . Hyperlipidemia 07/07/2019  . Superior vena cava syndrome   . History of histoplasmosis    Kristopher Pope, PT, DPT Kristopher Pope 08/31/2019, 4:15 PM  [place], [place], [address] Phone: [phone]   Fax:  [phone]  Name: Kristopher Pope MRN:  [id number] Date of Birth: 04/15/1955

## 2019-09-01 NOTE — Telephone Encounter (Signed)
Has been seen for f/u

## 2019-09-02 ENCOUNTER — Encounter: Payer: Self-pay | Admitting: Family Medicine

## 2019-09-02 ENCOUNTER — Ambulatory Visit (INDEPENDENT_AMBULATORY_CARE_PROVIDER_SITE_OTHER): Payer: BC Managed Care – PPO | Admitting: Family Medicine

## 2019-09-02 ENCOUNTER — Ambulatory Visit: Payer: BC Managed Care – PPO | Admitting: Physical Therapy

## 2019-09-02 ENCOUNTER — Other Ambulatory Visit: Payer: Self-pay

## 2019-09-02 ENCOUNTER — Encounter: Payer: Self-pay | Admitting: Physical Therapy

## 2019-09-02 VITALS — BP 122/64 | HR 81 | Temp 98.2°F | Ht 71.0 in | Wt 179.0 lb

## 2019-09-02 DIAGNOSIS — Z8619 Personal history of other infectious and parasitic diseases: Secondary | ICD-10-CM

## 2019-09-02 DIAGNOSIS — M545 Low back pain, unspecified: Secondary | ICD-10-CM

## 2019-09-02 DIAGNOSIS — J189 Pneumonia, unspecified organism: Secondary | ICD-10-CM

## 2019-09-02 DIAGNOSIS — M6281 Muscle weakness (generalized): Secondary | ICD-10-CM | POA: Diagnosis not present

## 2019-09-02 DIAGNOSIS — I808 Phlebitis and thrombophlebitis of other sites: Secondary | ICD-10-CM | POA: Diagnosis not present

## 2019-09-02 DIAGNOSIS — J439 Emphysema, unspecified: Secondary | ICD-10-CM

## 2019-09-02 DIAGNOSIS — I871 Compression of vein: Secondary | ICD-10-CM

## 2019-09-02 DIAGNOSIS — Z8709 Personal history of other diseases of the respiratory system: Secondary | ICD-10-CM

## 2019-09-02 DIAGNOSIS — I700 Atherosclerosis of aorta: Secondary | ICD-10-CM | POA: Insufficient documentation

## 2019-09-02 DIAGNOSIS — I809 Phlebitis and thrombophlebitis of unspecified site: Secondary | ICD-10-CM | POA: Insufficient documentation

## 2019-09-02 NOTE — Patient Instructions (Addendum)
Please stop by lab before you go If you have mychart- we will send your results within 3 business days of [place] receiving them.  If you do not have mychart- we will call you about results within 5 business days of [place] receiving them.   Go to aspirin 81Mg  after taking aspirin 325Mg .

## 2019-09-02 NOTE — Assessment & Plan Note (Signed)
1 month of aspirin 325 mg planned -after IV during pneumonia hospitalization June 2021

## 2019-09-02 NOTE — Assessment & Plan Note (Signed)
From CT while hospitalized  "1. Dense patchy consolidation and ground-glass opacity in the medial basilar right lower lobe, most compatible with pneumonia. Nonspecific noncalcified subcarinal adenopathy, potentially reactive. Recommend attention on follow-up chest CT with IV contrast in 3 months. 2. Moderate diffuse varicoid bronchiectasis, most prominent in the upper lobes. Thick bandlike regions of postinfectious scarring in the mid to upper right lung."  Plan has been discussed with pulmonology and 3 month repeat chest CT planned.  He sees pulmonology in early July.  I am going to set a reminder 3 months from today to make sure CT has been ordered-if not we would pursue this

## 2019-09-02 NOTE — Assessment & Plan Note (Addendum)
Pneumonia of the right lung-led to acute respiratory failure-thankfully patient was able to be weaned off of oxygen and discharged home on Augmentin.  He has completed Augmentin at this point.  He is working with physical therapy to get his strength back-I think this is a great idea and we discussed sometimes can take a month or 2 after hospitalization to regain strength.  I completed paperwork for FMLA and short-term disability from May 29 to June 11-he will return to work tomorrow.  Given he has had COVID-19 and now pneumonia I would recommend he work from home though

## 2019-09-02 NOTE — Assessment & Plan Note (Addendum)
Noted after visit with patient today-we will need to discuss this at next physical--this would lower my threshold for statin therapy Lab Results  Component Value Date   CHOL 231 (H) 07/07/2019   HDL 90.00 07/07/2019   LDLCALC 128 (H) 07/07/2019   TRIG 65.0 07/07/2019   CHOLHDL 3 07/07/2019

## 2019-09-02 NOTE — Therapy (Signed)
[place], [place], [address] Phone: [phone]   Fax:  [phone]  Physical Therapy Treatment  Patient Details  Name: Kristopher Pope MRN: [id number] Date of Birth: May 20, 1955 Referring Provider (PT): Garret Reddish   Encounter Date: 09/02/2019   PT End of Session - 09/02/19 0914    Visit Number 5    Date for PT Re-Evaluation 10/03/19    PT Start Time 0835    PT Stop Time 0925    PT Time Calculation (min) 50 min    Activity Tolerance Patient tolerated treatment well    Behavior During Therapy [facility] for tasks assessed/performed           Past Medical History:  Diagnosis Date  . Chicken pox   . 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 Seizures ([place])    around time of histoplasmosis diagnosis  . 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 CHOLECYSTECTOMY  1995  . left wrist surgery     cyst    There were no vitals filed for this visit.   Subjective Assessment - 09/02/19 0839    Subjective Pt states his back is feeling much better; no pain with any activity since last Sunday    Currently in Pain? No/denies    Pain Score 0-No pain    Pain Location Back                             [facility] Adult PT Treatment/Exercise - 09/02/19 0001      Lumbar Exercises: Aerobic   Nustep L3 x 8 min      Lumbar Exercises: Machines for Strengthening   Cybex Lumbar Extension 2x15 black TB    Cybex Knee Extension 10# 2x15    Cybex Knee Flexion 25# 2x15    Leg Press 60# 2x10    Other Lumbar Machine Exercise 25# 2x15 rows and lats    Other Lumbar Machine Exercise shoulder ext 10# 2x15, AR press 20# 1x15 B      Lumbar Exercises: Standing   Other Standing Lumbar Exercises lumbar ext over exercise ball  with 5 sec hold x10    Other Standing Lumbar Exercises mini squat with exercise ball x10 sec hold x10 reps                    PT Short Term Goals - 08/19/19 4680      PT SHORT TERM GOAL #1   Title Pt will be independent with HEP    Status Partially Met             PT Long Term Goals - 08/19/19 0924      PT LONG TERM GOAL #3   Title Pt will reduce reports of LBP by 50%    Status Partially Met                 Plan - 09/02/19 0922    Clinical Impression Statement Pt progressing well with TE; no complaints of increased LBP with exercise today. O2 ranged from 93%-98% with recovery after<10mn of pursed lip breathing; pt still recovering from pnemonia and required frequent rest breaks. Pt is returning to remote job with [employer] and  will be working from home 10hr days for the next four days; states he will let us know how [facility] did with this. Probable d/c at last scheduled appt next week.    PT Treatment/Interventions ADLs/Self Care Home Management;Electrical Stimulation;Moist Heat;Iontophoresis 68m/ml Dexamethasone;Gait training;Stair training;Functional mobility training;Therapeutic activities;Therapeutic exercise;Balance training;Neuromuscular re-education;Manual techniques;Patient/family education;Passive range of motion;Dry needling    PT Next Visit Plan Progress LE and lumbar strengthening/flexibility, manual/modalities as indicated    Consulted and Agree with Plan of Care Patient           Patient will benefit from skilled therapeutic intervention in order to improve the following deficits and impairments:  Abnormal gait, Decreased range of motion, Difficulty walking, Decreased endurance, Increased muscle spasms, Decreased activity tolerance, Pain, Hypomobility, Impaired flexibility, Decreased strength, Decreased mobility  Visit Diagnosis: Muscle weakness (generalized)  Acute bilateral low back pain without sciatica     Problem List Patient Active Problem  List   Diagnosis Date Noted  . CAP (community acquired pneumonia) 08/21/2019  . Community acquired pneumonia of right lower lobe of lung 08/20/2019  . Hyperlipidemia 07/07/2019  . Superior vena cava syndrome   . History of histoplasmosis    AAmador Cunas PT, DPT ADonald ProseSugg 09/02/2019, 9:26 AM  [place] [address]Phone: [phone]  Fax:  [phone] Name: SYue GlasheenMuslim MRN: [id number] of Birth: 706-Oct-1957

## 2019-09-02 NOTE — Assessment & Plan Note (Signed)
Superior vena cava syndrome thought related to histoplasmosis. From CT during this hospitalization "Chronic SVC occlusion with opacification of numerous ventral chest wall venous collaterals draining to the IVC. Finding probably due to remote granulomatous infection given calcified mediastinal adenopathy and calcified left upper lobe granuloma. 4. Dilated main pulmonary artery, suggesting pulmonary arterial hypertension. 5. Aortic Atherosclerosis (ICD10-I70.0) and Emphysema (ICD10-J43.9)."  -I would consider ongoing aspirin therapy given SVC syndrome as well as loss of numerous venous collaterals.  Would probably use 81 mg.  I am also open to pulmonology's opinion. -He does have pulmonology follow-up and I would like their opinion on pulmonary arterial hypertension/suspect related to histoplasmosis and prior encasing.  I would also like their opinion on emphysema-possible PFTs

## 2019-09-02 NOTE — Assessment & Plan Note (Addendum)
Never smoker-noted CT September 02, 2019-we will get pulmonology's opinion at follow-up.  We will plan to discuss this at next visit-noted after our visit today

## 2019-09-02 NOTE — Progress Notes (Signed)
Phone [phone]   Subjective:  Kristopher Pope is a 64 y.o. year old very pleasant male patient who presents for transitional care management and hospital follow up for Pneumonia. Patient was hospitalized from 08/20/2019 to 08/24/2019 . A TCM phone call was completed on 08/26/2019. Medical complexity moderate  64 year old male with history of histoplasmosis in the 1990s which ultimately led to superior vena cava syndrome presented to the hospital with shortness of breath and fever for 3 days.  Patient has recovered from Covid last November.  Patient reported fatigue, fever, body aches and fatigue became rapidly progressive and felt he was too sick to Stay home-he was febrile to 102.5 in the emergency room and chest x-ray showed opacities in right lung.  Chest CT showed residual scarring possibly related to prior COVID-19.  COVID-19 testing was negative during admission.  Patient initially treated with IV antibiotics.  Patient also suffered from acute hypoxic respiratory failure requiring 2 L of oxygen luckily he was able to be weaned off of this before discharge.  He was initially treated with Rocephin and azithromycin but discharged on Augmentin for a total of 1 week of therapy.  I personally reviewed chest x-ray from 08/20/2019: No bony abnormalities noted Left lung field appears largely clear other than some thickening of the hila.  Cardiac silhouette largely normal.  Right lung with opacity in the right lower lobe.  Some thickening of the hila on the right with right paratracheal stripe concerning for possible adenopathy.  Decreased volume right compared to left hemithorax  From CT 08/20/2019: -There was a dense patchy consolidation and groundglass opacity in the medial basilar right lower lobe with nonspecific noncalcified subcarinal adenopathy thought to be potentially reactive-follow-up chest CT in 3 months was recommended.  Pulmonary was consulted and they made the recommendation of  54-month CT in light of adenopathy with bronchiectasis and history of histoplasmosis.  Left upper extremity swelling related to IV in that arm the emergency department.  Venous duplex showed left upper extremity superficial thrombophlebitis-patient was recommended to take aspirin 325 mg daily for 1 month.  He was advised not to take blackseed oil while on aspirin due to potential blood thinning effect  Chronic SVC occlusion-with opacification of numerous ventral chest wall venous collaterals down to the IVC likely due to nongranulomatous infection.  I recommended continuing aspirin-to transition 81 mg Imuran discussion today after coming off of 325  He is working with physical therapy at [place]. He is trying to do some walking- up to a quarter mile 4x one day but he felt really fatigued and some shortness of breath. We discussed may take 1-2 months to recover fully. Working with Chiropodist.     See problem oriented charting as well  Past Medical History-  Patient Active Problem List   Diagnosis Date Noted  . Superior vena cava syndrome     Priority: High  . History of histoplasmosis     Priority: High  . Aortic atherosclerosis ([place]) 09/02/2019    Priority: Medium  . Community acquired pneumonia of right lower lobe of lung 08/20/2019    Priority: Medium  . Hyperlipidemia 07/07/2019    Priority: Medium  . Emphysema lung ([place]) 09/02/2019    Priority: Low    Medications- reviewed and updated  A medical reconciliation was performed comparing current medicines to hospital discharge medications. Current Outpatient Medications  Medication Sig Dispense Refill  . aspirin EC 325 MG tablet Take 1 tablet (325 mg total) by mouth daily. Take  one tab daily for 30 days then resume 81 mg daily 100 tablet 3  . ELDERBERRY PO Take 1 capsule by mouth daily.    . Ginkgo Biloba (GNP GINGKO BILOBA EXTRACT PO) Take 1 tablet by mouth daily.    . Omega-3 Fatty Acids (FISH OIL) 1000 MG CAPS Take  1,000 mg by mouth daily.    Marland Kitchen saccharomyces boulardii (FLORASTOR) 250 MG capsule Take 1 capsule (250 mg total) by mouth 2 (two) times daily. 60 capsule 0  . Turmeric (QC TUMERIC COMPLEX) 500 MG CAPS Take 500 mg by mouth daily.     No current facility-administered medications for this visit.   Objective  Objective:  BP 122/64   Pulse 81   Temp 98.2 F (36.8 C)   Ht 5\' 11"  (1.803 m)   Wt 179 lb (81.2 kg)   SpO2 97%   BMI 24.97 kg/m  Gen: NAD, resting comfortably CV: RRR no murmurs rubs or gallops Lungs: CTAB no crackles, wheeze, rhonchi Abdomen: soft/nontender/nondistended/normal bowel sounds.  Ext: no edema Skin: warm, dry Neuro: grossly normal, moves all extremities     Assessment and Plan:  #Transitional care visit-community acquired pneumonia   Community acquired pneumonia of right lower lobe of lung Pneumonia of the right lung-led to acute respiratory failure-thankfully patient was able to be weaned off of oxygen and discharged home on Augmentin.  He has completed Augmentin at this point.  He is working with physical therapy to get his strength back-I think this is a great idea and we discussed sometimes can take a month or 2 after hospitalization to regain strength.  I completed paperwork for FMLA and short-term disability from May 29 to June 11-he will return to work tomorrow.  Given he has had COVID-19 and now pneumonia I would recommend he work from home though  Superficial thrombophlebitis 1 month of aspirin 325 mg planned -after IV during pneumonia hospitalization June 2021  History of histoplasmosis From CT while hospitalized  "1. Dense patchy consolidation and ground-glass opacity in the medial basilar right lower lobe, most compatible with pneumonia. Nonspecific noncalcified subcarinal adenopathy, potentially reactive. Recommend attention on follow-up chest CT with IV contrast in 3 months. 2. Moderate diffuse varicoid bronchiectasis, most prominent in the upper  lobes. Thick bandlike regions of postinfectious scarring in the mid to upper right lung."  Plan has been discussed with pulmonology and 3 month repeat chest CT planned.  He sees pulmonology in early July.  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 From CT during this hospitalization "Chronic SVC occlusion with opacification of numerous ventral chest wall venous collaterals draining to the IVC. Finding probably due to remote granulomatous infection given calcified mediastinal adenopathy and calcified left upper lobe granuloma. 4. Dilated main pulmonary artery, suggesting pulmonary arterial hypertension. 5. Aortic Atherosclerosis (ICD10-I70.0) and Emphysema (ICD10-J43.9)."  -I would consider ongoing aspirin therapy given SVC syndrome as well as loss of numerous venous collaterals.  Would probably use 81 mg.  I am also open to pulmonology's opinion. -He does have pulmonology follow-up and I would like their opinion on pulmonary arterial hypertension/suspect related to histoplasmosis and prior encasing.  I would also like their opinion on emphysema-possible PFTs  Emphysema lung (HCC) Never smoker-noted CT September 02, 2019-we will get pulmonology's opinion at follow-up.  We will plan to discuss this at next  visit-noted after our visit today  Aortic atherosclerosis (HCC) Noted after visit with patient today-we will need to discuss this at next physical--this would lower my threshold for statin therapy Lab Results  Component Value Date   CHOL 231 (H) 07/07/2019   HDL 90.00 07/07/2019   LDLCALC 128 (H) 07/07/2019   TRIG 65.0 07/07/2019   CHOLHDL 3 07/07/2019       Recommended follow up: Should continue at least annual physicals in April-happy to see him sooner if needed Future Appointments  Date Time Provider Department Center   09/07/2019  8:45 AM Teresa Pelton, PTA [facility]  09/09/2019  8:45 AM Jearld Lesch, PT [facility]  09/22/2019  9:00 AM Kalman Shan, MD [facility] None  12/08/2019  2:00 PM [facility] NURSE [facility]    Lab/Order associations:   ICD-10-CM   1. Community acquired pneumonia of right lung, unspecified part of lung  J18.9 Basic metabolic panel    CBC with Differential/Platelet    CBC with Differential/Platelet    Basic metabolic panel    CANCELED: Basic metabolic panel    CANCELED: CBC with Differential/Platelet  2. History of acute respiratory failure  Z87.09   3. Community acquired pneumonia of right lower lobe of lung  J18.9   4. Superficial thrombophlebitis of left upper extremity  I80.8   5. History of histoplasmosis  Z86.19   6. Superior vena cava syndrome  I87.1   7. Pulmonary emphysema, unspecified emphysema type (HCC)  J43.9   8. Aortic atherosclerosis (HCC)  I70.0    Return precautions advised.  Tana Conch, MD

## 2019-09-03 LAB — CBC WITH DIFFERENTIAL/PLATELET
Absolute Monocytes: 656 cells/uL (ref 200–950)
Basophils Absolute: 49 cells/uL (ref 0–200)
Basophils Relative: 0.6 %
Eosinophils Absolute: 146 cells/uL (ref 15–500)
Eosinophils Relative: 1.8 %
HCT: 38.1 % — ABNORMAL LOW (ref 38.5–50.0)
Hemoglobin: 12.3 g/dL — ABNORMAL LOW (ref 13.2–17.1)
Lymphs Abs: 1904 cells/uL (ref 850–3900)
MCH: 27.7 pg (ref 27.0–33.0)
MCHC: 32.3 g/dL (ref 32.0–36.0)
MCV: 85.8 fL (ref 80.0–100.0)
MPV: 9.8 fL (ref 7.5–12.5)
Monocytes Relative: 8.1 %
Neutro Abs: 5346 cells/uL (ref 1500–7800)
Neutrophils Relative %: 66 %
Platelets: 447 10*3/uL — ABNORMAL HIGH (ref 140–400)
RBC: 4.44 10*6/uL (ref 4.20–5.80)
RDW: 12.7 % (ref 11.0–15.0)
Total Lymphocyte: 23.5 %
WBC: 8.1 10*3/uL (ref 3.8–10.8)

## 2019-09-03 LAB — BASIC METABOLIC PANEL
BUN: 15 mg/dL (ref 7–25)
CO2: 24 mmol/L (ref 20–32)
Calcium: 9.6 mg/dL (ref 8.6–10.3)
Chloride: 108 mmol/L (ref 98–110)
Creat: 1.04 mg/dL (ref 0.70–1.25)
Glucose, Bld: 104 mg/dL — ABNORMAL HIGH (ref 65–99)
Potassium: 4 mmol/L (ref 3.5–5.3)
Sodium: 140 mmol/L (ref 135–146)

## 2019-09-07 ENCOUNTER — Ambulatory Visit: Payer: BC Managed Care – PPO

## 2019-09-07 ENCOUNTER — Encounter: Payer: BC Managed Care – PPO | Admitting: Physical Therapy

## 2019-09-09 ENCOUNTER — Ambulatory Visit: Payer: BC Managed Care – PPO | Admitting: Physical Therapy

## 2019-09-09 ENCOUNTER — Encounter: Payer: Self-pay | Admitting: Physical Therapy

## 2019-09-09 ENCOUNTER — Other Ambulatory Visit: Payer: Self-pay

## 2019-09-09 DIAGNOSIS — M545 Low back pain, unspecified: Secondary | ICD-10-CM

## 2019-09-09 DIAGNOSIS — M6281 Muscle weakness (generalized): Secondary | ICD-10-CM | POA: Diagnosis not present

## 2019-09-09 NOTE — Therapy (Signed)
[place], [place], [address] Phone: [phone]   Fax:  [phone]  Physical Therapy Treatment  Patient Details  Name: Kristopher Pope MRN: [id number] Date of Birth: [birth year] Referring Provider (PT): Garret Reddish   Encounter Date: 09/09/2019   PT End of Session - 09/09/19 0910    Visit Number 6    Date for PT Re-Evaluation 10/03/19    PT Start Time 0835    PT Stop Time 0925    PT Time Calculation (min) 50 min    Activity Tolerance Patient tolerated treatment well    Behavior During Therapy [facility] for tasks assessed/performed           Past Medical History:  Diagnosis Date  . Chicken pox   . 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 Seizures ([place])    around time of histoplasmosis diagnosis  . 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 CHOLECYSTECTOMY  1995  . left wrist surgery     cyst    There were no vitals filed for this visit.   Subjective Assessment - 09/09/19 0842    Subjective Patient had some testing done last week and the MD felt like he was progressing well.  Started work last Saturday, reports that he is fatigued but "did okay"    Currently in Pain? No/denies                             [facility] Adult PT Treatment/Exercise - 09/09/19 0001      Lumbar Exercises: Aerobic   Nustep L4 x 8 min      Lumbar Exercises: Machines for Strengthening   Cybex Knee Extension 10# 2x15    Cybex Knee Flexion 25# 2x15    Leg Press 60# 2x10    Other Lumbar Machine Exercise 25# 2x15 rows and lats, chest press 15# 2x15    Other Lumbar Machine Exercise shoulder ext 10# 2x15, AR press 20# 1x15 bilaterally, 25# squat row x 15      Lumbar Exercises: Standing   Other Standing Lumbar  Exercises resisted gait all directions                    PT Short Term Goals - 08/19/19 4944      PT SHORT TERM GOAL #1   Title Pt will be independent with HEP    Status Partially Met             PT Long Term Goals - 09/09/19 0925      PT LONG TERM GOAL #1   Title Pt will report ability to sleep through the night without waking d/t LBP      PT LONG TERM GOAL #2   Title Pt will demonstrate B hip extension/abduction MMT 4+/5    Status On-going      PT LONG TERM GOAL #3   Title Pt will reduce reports of LBP by 50%    Status Partially Met                 Plan - 09/09/19 0910    Clinical Impression Statement Patients O2 stayed at 95% throughout the session.  He got really short of  breath with multi joint compound motions and with the resisted walking.  No pain with any exercises.  Needed some cues for breathing and rests    PT Next Visit Plan continue to work on core strength    Consulted and Agree with Plan of Care Patient           Patient will benefit from skilled therapeutic intervention in order to improve the following deficits and impairments:  Abnormal gait, Decreased range of motion, Difficulty walking, Decreased endurance, Increased muscle spasms, Decreased activity tolerance, Pain, Hypomobility, Impaired flexibility, Decreased strength, Decreased mobility  Visit Diagnosis: Muscle weakness (generalized)  Acute bilateral low back pain without sciatica     Problem List Patient Active Problem List   Diagnosis Date Noted  . Emphysema lung ([place]) 09/02/2019  . Aortic atherosclerosis ([place]) 09/02/2019  . Community acquired pneumonia of right lower lobe of lung 08/20/2019  . Hyperlipidemia 07/07/2019  . Superior vena cava syndrome   . History of histoplasmosis     Sumner Boast., PT 09/09/2019, 9:26 AM  [facility] [place], [place], [address] Phone: [phone]    Fax:  [phone]  Name: Kristopher Pope MRN: [id number] Date of Birth: 1956-02-05

## 2019-09-21 ENCOUNTER — Other Ambulatory Visit: Payer: Self-pay

## 2019-09-21 ENCOUNTER — Encounter: Payer: Self-pay | Admitting: Physical Therapy

## 2019-09-21 ENCOUNTER — Ambulatory Visit: Payer: BC Managed Care – PPO | Admitting: Physical Therapy

## 2019-09-21 DIAGNOSIS — M6281 Muscle weakness (generalized): Secondary | ICD-10-CM

## 2019-09-21 DIAGNOSIS — M545 Low back pain, unspecified: Secondary | ICD-10-CM

## 2019-09-21 NOTE — Therapy (Signed)
[place], [place], [address] Phone: [phone]   Fax:  [phone]  Physical Therapy Treatment  Patient Details  Name: Kristopher Pope MRN: [id number] Date of Birth: [birth year] Referring Provider (PT): Garret Reddish   Encounter Date: 09/21/2019   PT End of Session - 09/21/19 0836    Visit Number 7    Date for PT Re-Evaluation 10/03/19    PT Start Time 0748    PT Stop Time 0838    PT Time Calculation (min) 50 min    Activity Tolerance Patient tolerated treatment well           Past Medical History:  Diagnosis Date   Chicken pox    Histoplasmosis    had a trach and noted to have "encasing of trachea"-had been on dilantain and coumadin in past   Seizures ([place])    around time of histoplasmosis diagnosis   Superior vena cava syndrome    states related to histoplasmosis, no problems, no meds    Past Surgical History:  Procedure Laterality Date   CHOLECYSTECTOMY  1995   left wrist surgery     cyst    There were no vitals filed for this visit.   Subjective Assessment - 09/21/19 0754    Subjective Patient reports last session was "very good", reports a little sore this morning    Currently in Pain? Yes    Pain Location Back    Pain Descriptors / Indicators Sore    Aggravating Factors  morning                             [facility] Adult PT Treatment/Exercise - 09/21/19 0001      Lumbar Exercises: Stretches   Passive Hamstring Stretch Right;Left;4 reps;10 seconds      Lumbar Exercises: Aerobic   Elliptical I=10, R=6 x 2 minutes, O2 saturation 90%    Nustep L6 x 6 min      Lumbar Exercises: Machines for Strengthening   Cybex Knee Extension 10# 2x15    Cybex Knee Flexion 25# 2x15    Other Lumbar Machine Exercise 25# 2x15 rows and lats, chest press 15# 2x15, triceps 25#, biceps 10#    Other Lumbar Machine Exercise shoulder ext 10# 2x15, AR press 20#  1x15 bilaterally, 25# squat row x 15, 5# hip extension and abduction      Lumbar Exercises: Standing   Other Standing Lumbar Exercises 6# each hand deadlift to 6"                    PT Short Term Goals - 08/19/19 8527      PT SHORT TERM GOAL #1   Title Pt will be independent with HEP    Status Partially Met             PT Long Term Goals - 09/21/19 0839      PT LONG TERM GOAL #1   Title Pt will report ability to sleep through the night without waking d/t LBP    Status Partially Met      PT LONG TERM GOAL #2   Title Pt will demonstrate B hip extension/abduction MMT 4+/5    Status Partially Met                 Plan - 09/21/19 0837    Clinical Impression Statement Patient at 45 minutes of activity started  to "feel sick" he reports it is from the insufficiency.  He had O2 drop to 90% after the elliptical, but 2 minute recovery it was back to 97%.  He did not take as many breaks today but felt like we did larger mm groups that may have caused the feeling    PT Next Visit Plan work on core strength    Consulted and Agree with Plan of Care Patient           Patient will benefit from skilled therapeutic intervention in order to improve the following deficits and impairments:  Abnormal gait, Decreased range of motion, Difficulty walking, Decreased endurance, Increased muscle spasms, Decreased activity tolerance, Pain, Hypomobility, Impaired flexibility, Decreased strength, Decreased mobility  Visit Diagnosis: Muscle weakness (generalized)  Acute bilateral low back pain without sciatica     Problem List Patient Active Problem List   Diagnosis Date Noted   Emphysema lung ([place]) 09/02/2019   Aortic atherosclerosis ([place]) 09/02/2019   Community acquired pneumonia of right lower lobe of lung 08/20/2019   Hyperlipidemia 07/07/2019   Superior vena cava syndrome    History of histoplasmosis     Sumner Boast., PT 09/21/2019, 8:40 AM  [place], [place], [address] Phone: [phone]   Fax:  [phone]  Name: Kristopher Pope MRN: [id number] Date of Birth: 21-Nov-1955

## 2019-09-22 ENCOUNTER — Encounter: Payer: Self-pay | Admitting: Internal Medicine

## 2019-09-22 ENCOUNTER — Ambulatory Visit (INDEPENDENT_AMBULATORY_CARE_PROVIDER_SITE_OTHER): Payer: BC Managed Care – PPO | Admitting: Internal Medicine

## 2019-09-22 VITALS — BP 110/68 | HR 78 | Temp 98.0°F | Ht 71.5 in | Wt 182.4 lb

## 2019-09-22 DIAGNOSIS — Z8619 Personal history of other infectious and parasitic diseases: Secondary | ICD-10-CM | POA: Diagnosis not present

## 2019-09-22 DIAGNOSIS — I871 Compression of vein: Secondary | ICD-10-CM | POA: Diagnosis not present

## 2019-09-22 DIAGNOSIS — R0609 Other forms of dyspnea: Secondary | ICD-10-CM

## 2019-09-22 DIAGNOSIS — R0600 Dyspnea, unspecified: Secondary | ICD-10-CM

## 2019-09-22 DIAGNOSIS — Z8701 Personal history of pneumonia (recurrent): Secondary | ICD-10-CM

## 2019-09-22 DIAGNOSIS — K029 Dental caries, unspecified: Secondary | ICD-10-CM

## 2019-09-22 NOTE — Addendum Note (Signed)
Addended by: Melonie [place] on: 09/22/2019 10:17 AM   Modules accepted: Orders

## 2019-09-22 NOTE — Patient Instructions (Signed)
ICD-10-CM   1. Dyspnea on exertion  R06.00   2. History of recent pneumonia  Z87.01   3. SVC syndrome  I87.1   4. History of histoplasmosis  Z86.19   5. Carious teeth  K02.9     For svc syndrome:  - Do echo anytime nex few to several weeks -we will call with the results -Do pulmonary function test end of August 2021/early September 2021  For history of recent pneumonia = glad it is getting better  -Do repeat CT scan of the chest without contrast approximately end of August 2021/early September 2021  -Go see a dentist because of carious teeth that can be a risk factor for pneumonia  Follow-up -End of August 2021/early September 2021 -to discuss CT scan and PFT results with Dr. Marchelle Gearing

## 2019-09-22 NOTE — Progress Notes (Signed)
OV 09/22/2019  Subjective:  Patient ID: Kristopher Pope, male , DOB: 23-Oct-1955 , age 64 y.o. , MRN: [id number] , ADDRESS: [address] [place] [address]   09/22/2019 -   Chief Complaint  Patient presents with  . Consult    shortness of breath with vigorous exercise but otherwise no complaints   History is given talking to the patient and review of the chart and visualization of the radiographic images.  HPI Kristopher Pope 64 y.o. -history of histoplasmosis while living in [place] in the late 1980s in early 1990s.  Treated subsequently at [facility] [place] in [place].  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  After that he has been mostly sedentary because of dyspnea on exertion but over the next 20 or 30 years nothing ever drastically change in his health and is doing well.  He moved to [place], [place] in 2017.  He works from home in [place].  He tells me that November 2020 he sustained COVID-19.  This was treated as an outpatient.  He never got any chest x-ray or any images.  He had a loss of taste and smell.  He recovered from that.  Then abruptly and end of May 2021 he developed fever and pneumonia symptoms.  He presented to the ER.  His CT angiogram chest showed right lower lobe groundglass opacities consistent with pneumonia diagnosis.  No specific etiology for the pneumonia was found.  He was found to have SVC syndrome which he is known to have.  Because of this pulmonary referral has been made.  He is not sure why he got the pneumonia but he does admit to poor dentition and has not seen his dentist in a long time.  He did have one episode of vomiting prior to falling sick or subsequent to falling sick.  Post discharge he says he has had continuous improvement.  However he still has residual dyspnea on exertion but overall is improving.  He still has some fatigue but this is  also improving.  It is now 1 month approximately since he got discharged.  He does not have any cough orthopnea proximal nocturnal dyspnea edema hemoptysis or chest pain  We do not have a standard respiratory virus panel and urine Streptococcus and Legionella to review.   Simple office walk 185 feet x  3 laps goal with forehead probe 09/22/2019   O2 used ra  Number laps completed 3  Comments about pace avg  Resting Pulse Ox/HR 100% and 69/min  Final Pulse Ox/HR 95% and 91/min  Desaturated </= 88% no  Desaturated <= 3% points Yes, 5 pints  Got Tachycardic >/= 90/min yes  Symptoms at end of test Dyspneic at last lastp  Miscellaneous comments x     IMPRESSION: CT chest  May 2021 1. Dense patchy consolidation and ground-glass opacity in the medial basilar right lower lobe, most compatible with pneumonia. Nonspecific noncalcified subcarinal adenopathy, potentially reactive. Recommend attention on follow-up chest CT with IV contrast in 3 months. 2. Moderate diffuse varicoid bronchiectasis, most prominent in the upper lobes. Thick bandlike regions of postinfectious scarring in the mid to upper right lung. 3. Chronic SVC occlusion with opacification of numerous ventral chest wall venous collaterals draining to the IVC. Finding probably due to remote granulomatous infection given calcified mediastinal adenopathy and calcified left upper lobe granuloma. 4. Dilated main pulmonary artery, suggesting  pulmonary arterial hypertension. 5. Aortic Atherosclerosis (ICD10-I70.0) and Emphysema (ICD10-J43.9).   Electronically Signed   By: Delbert Phenix M.D.   On: 08/20/2019 08:46   ROS - per HPI  Results for [name], [name] "ASAD" (MRN [id number]) as of 09/22/2019 09:08  Ref. Range 08/24/2019 04:44  Creatinine Latest Ref Range: 0.61 - 1.24 mg/dL 8.34  Results for BRAISON, SNOKE [facility] "ASAD" (MRN [id number]) as of 09/22/2019 09:08  Ref. Range 09/02/2019 16:27  Hemoglobin Latest Ref  Range: 13.2 - 17.1 g/dL 89.2 (L)     has a past medical history of Chicken pox, Histoplasmosis, Seizures ([facility]), and Superior vena cava syndrome.   reports that he has never smoked. He has never used smokeless tobacco.  Past Surgical History:  Procedure Laterality Date  . CHOLECYSTECTOMY  1995  . left wrist surgery     cyst    Allergies  Allergen Reactions  . Codeine Nausea And Vomiting  . Phenergan [Promethazine Hcl] Nausea And Vomiting    Immunization History  Administered Date(s) Administered  . Influenza-Unspecified 12/22/2016  . Pneumococcal Polysaccharide-23 08/21/2019  . Tdap 08/16/2014  . Zoster Recombinat (Shingrix) 07/07/2019    Family History  Problem Relation Age of Onset  . Osteoarthritis Mother   . Hypertension Father   . Diabetes Father   . Diabetes Sister   . Congenital heart disease Brother        passed from this  . Colon cancer Neg Hx   . Colon polyps Neg Hx   . Rectal cancer Neg Hx   . Stomach cancer Neg Hx      Current Outpatient Medications:  .  aspirin EC 325 MG tablet, Take 1 tablet (325 mg total) by mouth daily. Take one tab daily for 30 days then resume 81 mg daily, Disp: 100 tablet, Rfl: 3 .  ELDERBERRY PO, Take 1 capsule by mouth daily., Disp: , Rfl:  .  Ginkgo Biloba (GNP GINGKO BILOBA EXTRACT PO), Take 1 tablet by mouth daily., Disp: , Rfl:  .  Omega-3 Fatty Acids (FISH OIL) 1000 MG CAPS, Take 1,000 mg by mouth daily., Disp: , Rfl:  .  Turmeric (QC TUMERIC COMPLEX) 500 MG CAPS, Take 500 mg by mouth daily., Disp: , Rfl:       Objective:   Vitals:   09/22/19 0857  BP: 110/68  Pulse: 78  Temp: 98 F (36.7 C)  TempSrc: Oral  SpO2: 95%  Weight: 182 lb 6.4 oz (82.7 kg)  Height: 5' 11.5" (1.816 m)    Estimated body mass index is 25.09 kg/m as calculated from the following:   Height as of this encounter: 5' 11.5" (1.816 m).   Weight as of this encounter: 182 lb 6.4 oz (82.7 kg).  @WEIGHTCHANGE @    09/22/19  0857  Weight: 182 lb 6.4 oz (82.7 kg)     Physical Exam  General Appearance:    Alert, cooperative, no distress, appears stated age - YES , Deconditioned looking - NO , OBESE  - NO, Sitting on Wheelchair -  NO  Head:    Normocephalic, without obvious abnormality, atraumatic  Eyes:    PERRL, conjunctiva/corneas clear,  Ears:    Normal TM's and external ear canals, both ears  Nose:   Nares normal, septum midline, mucosa normal, no drainage    or sinus tenderness. OXYGEN ON  - NO . Patient is @ RA   Throat:   Lips, mucosa, and tongue normal; cariouS TEETH +Cyanosis on lips - NO  Neck:   Supple, symmetrical, trachea midline, no adenopathy;    thyroid:  no enlargement/tenderness/nodules; no carotid   bruit or JVD  Back:     Symmetric, no curvature, ROM normal, no CVA tenderness  Lungs:     Distress - NO , Wheeze NO, Barrell Chest - NO, Purse lip breathing - NO, Crackles - NO   Chest Wall:    No tenderness or deformity. PROMINENET CHEST VEIN   Heart:    Regular rate and rhythm, S1 and S2 normal, no rub   or gallop, Murmur - NO  Breast Exam:    NOT DONE  Abdomen:     Soft, non-tender, bowel sounds active all four quadrants,    no masses, no organomegaly. Visceral obesity - NO  Genitalia:   NOT DONE  Rectal:   NOT DONE  Extremities:   Extremities - normal, Has Cane - NO, Clubbing - NO, Edema - NO  Pulses:   2+ and symmetric all extremities  Skin:   Stigmata of Connective Tissue Disease - NO  Lymph nodes:   Cervical, supraclavicular, and axillary nodes normal  Psychiatric:  Neurologic:   Pleasant - YES, Anxious - NO, Flat affect - NO  CAm-ICU - neg, Alert and Oriented x 3 - yes, Moves all 4s - yes, Speech - normal, Cognition - intact           Assessment:       ICD-10-CM   1. Dyspnea on exertion  R06.00   2. History of recent pneumonia  Z87.01   3. SVC syndrome  I87.1   4. History of histoplasmosis  Z86.19   5. Carious teeth  K02.9    He has SVC syndrome from his previous  histoplasmosis.  He has carious teeth.  Do not know what provoked his pneumonia because do not have access to general respiratory virus panel and urine Streptococcus and Legionella.  It is possible the carious teeth played a role.  Nevertheless overall he is improving.  He did drop 5 points with walking desaturation test but his oxygenation is still adequate.  There is no wheeze.  Overall exam is nonfocal except for prominent chest veins.  He is agreed for expectant and supportive monitoring approach.  I have asked him to see a dentist for his carious teeth.  We will do a follow-up CT chest and pulmonary function test in 3 months so we can better evaluate the infiltrates [we do not have previous imaging].  We will get echocardiogram of his heart because of the SVC syndrome.    Plan:     Patient Instructions     ICD-10-CM   1. Dyspnea on exertion  R06.00   2. History of recent pneumonia  Z87.01   3. SVC syndrome  I87.1   4. History of histoplasmosis  Z86.19   5. Carious teeth  K02.9     For svc syndrome:  - Do echo anytime nex few to several weeks -we will call with the results -Do pulmonary function test end of August 2021/early September 2021  For history of recent pneumonia = glad it is getting better  -Do repeat CT scan of the chest without contrast approximately end of August 2021/early September 2021  -Go see a dentist because of carious teeth that can be a risk factor for pneumonia  Follow-up -End of August 2021/early September 2021 -to discuss CT scan and PFT results with Dr. Lavell Anchors    Dr. Kalman Shan,  M.D., F.C.C.P,  Pulmonary and Critical Care Medicine Staff Physician, [facility] Center Director - Interstitial Lung Disease  Program  Pulmonary Fibrosis [facility] [place], [place], [id number]  Pager: [phone], If no answer or between  15:00h - 7:00h: call [phone] Telephone: [phone]  9:31 AM 09/22/2019

## 2019-09-23 ENCOUNTER — Other Ambulatory Visit: Payer: Self-pay

## 2019-09-23 ENCOUNTER — Ambulatory Visit: Payer: BC Managed Care – PPO | Attending: Family Medicine | Admitting: Physical Therapy

## 2019-09-23 DIAGNOSIS — M545 Low back pain, unspecified: Secondary | ICD-10-CM

## 2019-09-23 DIAGNOSIS — M6281 Muscle weakness (generalized): Secondary | ICD-10-CM | POA: Diagnosis not present

## 2019-09-23 NOTE — Therapy (Signed)
Kristopher Pope, [place], [address] Phone: [phone]   Fax:  [phone]  Physical Therapy Treatment  Patient Details  Name: Kristopher Pope MRN: [id number] Date of Birth: 01-Jul-1955 Referring Provider (PT): Kristopher Pope   Encounter Date: 09/23/2019   PT End of Session - 09/23/19 0846    Visit Number 8    Date for PT Re-Evaluation 10/03/19    PT Start Time 0800    PT Stop Time 0845    PT Time Calculation (min) 45 min    Activity Tolerance Patient tolerated treatment well    Behavior During Therapy [facility] for tasks assessed/performed           Past Medical History:  Diagnosis Date  . Chicken pox   . 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 Seizures ([place])    around time of histoplasmosis diagnosis  . 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 CHOLECYSTECTOMY  1995  . left wrist surgery     cyst    There were no vitals filed for this visit.   Subjective Assessment - 09/23/19 0803    Subjective Patient reports feeling okay, just tight in the lumbar area.    Pertinent History hx of seizures (has not had seizure in years)    Limitations Sitting;Lifting;Standing;Walking;House hold activities    Currently in Pain? No/denies                             [facility] Adult PT Treatment/Exercise - 09/23/19 0001      Lumbar Exercises: Aerobic   Nustep L 6 x 8 min      Lumbar Exercises: Machines for Strengthening   Other Lumbar Machine Exercise 35# 2 x 10 rows      Lumbar Exercises: Standing   Other Standing Lumbar Exercises resisted gait forward x 5 backward x 3 40#      Lumbar Exercises: Seated   Sit to Stand 10 reps    Sit to Stand Limitations x2 from UBE    Other Seated Lumbar Exercises  lumbar extention black Tband 2x15      Lumbar Exercises: Supine   Dead Bug 20 reps                    PT Short Term Goals - 08/19/19 2952      PT SHORT TERM GOAL #1   Title Pt will be independent with HEP    Status Partially Met             PT Long Term Goals - 09/21/19 0839      PT LONG TERM GOAL #1   Title Pt will report ability to sleep through the night without waking d/t LBP    Status Partially Met      PT LONG TERM GOAL #2   Title Pt will demonstrate B hip extension/abduction MMT 4+/5    Status Partially Met                 Plan - 09/23/19 0847    Clinical Impression Statement Patient tolerated progressions and new actitivty well. Patient dropped to 92% after sit to stands and 90% during resisted gait, however, recovered after  1 minute of pursed lip breathing. VC required for wider BOS and lower COG during resisted gait. Tactile cues for scapular depression during upper body TE.    Personal Factors and Comorbidities Age    Examination-Activity Limitations Bend;Carry;Lift;Sit;Sleep;Squat;Stairs;Stand    Examination-Participation Restrictions Community Activity    PT Frequency 2x / week    PT Duration 8 weeks    PT Treatment/Interventions ADLs/Self Care Home Management;Electrical Stimulation;Moist Heat;Iontophoresis 79m/ml Dexamethasone;Gait training;Stair training;Functional mobility training;Therapeutic activities;Therapeutic exercise;Balance training;Neuromuscular re-education;Manual techniques;Patient/family education;Passive range of motion;Dry needling    PT Next Visit Plan Continue with core and work on postural functional activities (picking up box). Consider lawnmower TE.    PT Home Exercise Plan bridges, lumbar rotation, child's pose fwd/lat, gastroc stretch    Consulted and Agree with Plan of Care Patient           Patient will benefit from skilled therapeutic intervention in order to improve the following deficits and impairments:   Abnormal gait, Decreased range of motion, Difficulty walking, Decreased endurance, Increased muscle spasms, Decreased activity tolerance, Pain, Hypomobility, Impaired flexibility, Decreased strength, Decreased mobility  Visit Diagnosis: Muscle weakness (generalized)  Acute bilateral low back pain without sciatica     Problem List Patient Active Problem List   Diagnosis Date Noted  . Emphysema lung ([place] 09/02/2019  . Aortic atherosclerosis ([place] 09/02/2019  . Community acquired pneumonia of right lower lobe of lung 08/20/2019  . Hyperlipidemia 07/07/2019  . Superior vena cava syndrome   . History of histoplasmosis     ELynann Bologna7/04/2019, 8:53 AM  [place] [address]Phone: [phone]  Fax:  [phone] Name: SDonnel Pope MRN: [id number] of Birth: 711-17-1957

## 2019-10-06 ENCOUNTER — Ambulatory Visit: Payer: BC Managed Care – PPO | Admitting: Family Medicine

## 2019-10-19 ENCOUNTER — Other Ambulatory Visit: Payer: Self-pay

## 2019-10-19 ENCOUNTER — Ambulatory Visit (HOSPITAL_COMMUNITY): Payer: BC Managed Care – PPO | Attending: Cardiology

## 2019-10-19 DIAGNOSIS — R0600 Dyspnea, unspecified: Secondary | ICD-10-CM | POA: Diagnosis not present

## 2019-10-19 DIAGNOSIS — I871 Compression of vein: Secondary | ICD-10-CM | POA: Diagnosis not present

## 2019-10-19 DIAGNOSIS — R0609 Other forms of dyspnea: Secondary | ICD-10-CM

## 2019-10-19 LAB — ECHOCARDIOGRAM COMPLETE
Area-P 1/2: 2.39 cm2
S' Lateral: 3.1 cm

## 2019-11-06 NOTE — Progress Notes (Signed)
Echo 7/28 with slight weakkness in heart pump  Plan   - refer to cards - first avail anyone is fine  - keep appt for CT 8/25  - get him PFT appt first avail - followup with APP after CT and PFT or fu with me

## 2019-11-09 NOTE — Progress Notes (Signed)
Attempted to call patient on phone number listed and it only rang and I was unable to leave any voicemail. Called wife, Dorothea's number listed on and per DPR and left voicemail to please have patient call us back. Left phone number to reach [place].

## 2019-11-11 ENCOUNTER — Telehealth: Payer: Self-pay | Admitting: Internal Medicine

## 2019-11-11 NOTE — Telephone Encounter (Signed)
ATC patient.  LMTCB. 

## 2019-11-16 ENCOUNTER — Other Ambulatory Visit: Payer: Self-pay

## 2019-11-16 ENCOUNTER — Ambulatory Visit (INDEPENDENT_AMBULATORY_CARE_PROVIDER_SITE_OTHER)
Admission: RE | Admit: 2019-11-16 | Discharge: 2019-11-16 | Disposition: A | Discharge status: Home / Self Care | Payer: BC Managed Care – PPO | Source: Ambulatory Visit | Attending: Internal Medicine | Admitting: Internal Medicine

## 2019-11-16 DIAGNOSIS — R0600 Dyspnea, unspecified: Secondary | ICD-10-CM

## 2019-11-16 DIAGNOSIS — J479 Bronchiectasis, uncomplicated: Secondary | ICD-10-CM | POA: Diagnosis not present

## 2019-11-16 DIAGNOSIS — I82210 Acute embolism and thrombosis of superior vena cava: Secondary | ICD-10-CM | POA: Diagnosis not present

## 2019-11-16 DIAGNOSIS — Z8701 Personal history of pneumonia (recurrent): Secondary | ICD-10-CM

## 2019-11-16 DIAGNOSIS — J984 Other disorders of lung: Secondary | ICD-10-CM | POA: Diagnosis not present

## 2019-11-16 DIAGNOSIS — R0609 Other forms of dyspnea: Secondary | ICD-10-CM

## 2019-11-16 DIAGNOSIS — I871 Compression of vein: Secondary | ICD-10-CM | POA: Diagnosis not present

## 2019-11-17 NOTE — Progress Notes (Signed)
Called and spoke with patient, provided CT results per Dr. Marchelle Gearing.  Patient verbalized understanding.  Scheduled f/u for 10/4 at 10 am.  Nothing further needed.

## 2019-11-22 ENCOUNTER — Other Ambulatory Visit: Payer: BC Managed Care – PPO

## 2019-11-22 NOTE — Telephone Encounter (Signed)
Pt has already been contacted regarding CT chest results. Nothing further needed at this time.

## 2019-12-08 ENCOUNTER — Ambulatory Visit (INDEPENDENT_AMBULATORY_CARE_PROVIDER_SITE_OTHER): Payer: BC Managed Care – PPO | Admitting: *Deleted

## 2019-12-08 DIAGNOSIS — Z23 Encounter for immunization: Secondary | ICD-10-CM | POA: Diagnosis not present

## 2019-12-08 NOTE — Progress Notes (Signed)
Pt in our clinic for #2 shingrix vaccine. Given to pt on rt deltoid. Pt tolerated well

## 2019-12-26 ENCOUNTER — Ambulatory Visit: Payer: BC Managed Care – PPO | Admitting: Internal Medicine

## 2020-07-04 ENCOUNTER — Telehealth: Payer: Self-pay

## 2020-07-04 NOTE — Telephone Encounter (Signed)
Unable to contact patient to get scheduled for a CPE.

## 2020-07-17 ENCOUNTER — Telehealth: Payer: Self-pay

## 2020-07-17 ENCOUNTER — Other Ambulatory Visit: Payer: Self-pay

## 2020-07-17 DIAGNOSIS — R972 Elevated prostate specific antigen [PSA]: Secondary | ICD-10-CM

## 2020-07-17 NOTE — Telephone Encounter (Signed)
-----   Message from Lorenza Evangelist, [place] sent at 07/17/2020  8:56 AM EDT ----- Regarding: FW: Cancellation of Order # [id number] Please schedule pt for lab visit for PSA recheck. ----- Message ----- From: Shelva Majestic, MD Sent: 07/12/2020   8:02 AM EDT To: Kathryne Eriksson Subject: FW: Cancellation of Order # [id number]          He still needs this PSA-needs to be reordered and set up a lab visit ----- Message ----- From: Shelby Mattocks Job Sent: 07/12/2020   5:34 AM EDT To: Shelva Majestic, MD Subject: Cancellation of Order # [id number]              Order number [id number] for the procedure PSA [LAB116] has been  canceled by [employer] [[email or web address]]. This procedure was ordered  by Shelva Majestic, MD [[id number]] on Jul 08, 2019 for the  patient Samarth Ogle Walberg [[id number]]. The reason for  cancellation was "Order Expired".  This was a future order.

## 2020-07-17 NOTE — Telephone Encounter (Signed)
Unable to LVM.

## 2020-12-28 NOTE — Progress Notes (Incomplete)
Phone: [phone]   Subjective:  Patient presents today for their annual physical. Chief complaint-noted.   See problem oriented charting- ROS- full  review of systems was completed and negative  except for: ***  The following were reviewed and entered/updated in epic: Past Medical History:  Diagnosis Date   Chicken pox    Histoplasmosis    had a trach and noted to have "encasing of trachea"-had been on dilantain and coumadin in past   Seizures (HCC)    around time of histoplasmosis diagnosis   Superior vena cava syndrome    states related to histoplasmosis, no problems, no meds   Patient Active Problem List   Diagnosis Date Noted   Emphysema lung (HCC) 09/02/2019   Aortic atherosclerosis (HCC) 09/02/2019   Community acquired pneumonia of right lower lobe of lung 08/20/2019   Hyperlipidemia 07/07/2019   Superior vena cava syndrome    History of histoplasmosis    Past Surgical History:  Procedure Laterality Date   CHOLECYSTECTOMY  1995   left wrist surgery     cyst    Family History  Problem Relation Age of Onset   Osteoarthritis Mother    Hypertension Father    Diabetes Father    Diabetes Sister    Congenital heart disease Brother        passed from this   Colon cancer Neg Hx    Colon polyps Neg Hx    Rectal cancer Neg Hx    Stomach cancer Neg Hx     Medications- reviewed and updated Current Outpatient Medications  Medication Sig Dispense Refill   ELDERBERRY PO Take 1 capsule by mouth daily.     Ginkgo Biloba (GNP GINGKO BILOBA EXTRACT PO) Take 1 tablet by mouth daily.     Omega-3 Fatty Acids (FISH OIL) 1000 MG CAPS Take 1,000 mg by mouth daily.     Turmeric (QC TUMERIC COMPLEX) 500 MG CAPS Take 500 mg by mouth daily.     No current facility-administered medications for this visit.    Allergies-reviewed and updated Allergies  Allergen Reactions   Codeine Nausea And Vomiting   Phenergan [Promethazine Hcl] Nausea And Vomiting    Social History    Social History Narrative   Family: Married. 2 grandsons in the home. 3 grown children. 11 grandkids total. No greatgrandkids.       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       Hobbies:  casual basketball, walking, writing- wrote sports in the past, now does a highly infrequent blog per him   Objective  Objective:  There were no vitals taken for this visit. Gen: NAD, resting comfortably HEENT: Mucous membranes are moist. Oropharynx normal Neck: no thyromegaly CV: RRR no murmurs rubs or gallops Lungs: CTAB no crackles, wheeze, rhonchi Abdomen: soft/nontender/nondistended/normal bowel sounds. No rebound or guarding.  Ext: no edema Skin: warm, dry Neuro: grossly normal, moves all extremities, PERRLA ***   Assessment and Plan  65 y.o. male presenting for annual physical.  Health Maintenance counseling: 1. Anticipatory guidance: Patient counseled regarding regular dental exams -  has not had in several years. Given list of local dentist***q6 months, eye exams - -yearly has not had in 2 years looking for new eye center ***,  avoiding smoking and second hand smoke*** , limiting alcohol to 2 beverages per day- Has not drank in 30+ years  ***.  No illicit drugs - *** 2. Risk factor reduction:  Advised patient of need for  regular exercise and diet rich and fruits and vegetables to reduce risk of heart attack and stroke. Exercise- Does golf but would like to walk more--I encouraged him to exercise 150 minutes/week once back has healed . ***. Diet--Tries to eat healthy diet. Would like to work on reducing portion.  Patient is up 8 pounds from December and we discussed ways to get weight back down through healthy diet. He is in Ramadan right now so likely will lose some weight. Feels like eats mainly chicken/fish/veggies but feels like he could cut down on rice. ***.  Wt Readings from Last 3 Encounters:  09/22/19 182 lb 6.4 oz (82.7 kg)  09/02/19 179 lb  (81.2 kg)  08/20/19 178 lb (80.7 kg)   3. Immunizations/screenings/ancillary studies DISCUSSED:  -COVID vac  #1 - *** Immunization History  Administered Date(s) Administered   Influenza-Unspecified 12/22/2016   Pneumococcal Polysaccharide-23 08/21/2019   Tdap 08/16/2014   Zoster Recombinat (Shingrix) 07/07/2019, 12/08/2019   Health Maintenance Due  Topic Date Due   COVID-19 Vaccine (1) Never done   4. Prostate cancer screening- low risk prior PSA trend-we will update PSA today***  Lab Results  Component Value Date   PSA 1.63 07/07/2019   PSA 1.02 06/10/2018   PSA 1.18 01/05/2017   5. Colon cancer screening -  Last done 2018. Repeat in 10years with normal colonoscopy.  *** 6. Skin cancer screening- -low risk due to melanin content.***advised regular sunscreen use. Denies worrisome, changing, or new skin lesions.  7. Smoking associated screening (lung cancer screening, AAA screen 65-75, UA)- Never smoker- *** 8. STD screening - ***  Status of chronic or acute concerns   #Transitional care visit-community acquired pneumonia    Community acquired pneumonia of right lower lobe of lung Pneumonia of the right lung-led to acute respiratory failure-thankfully patient was able to be weaned off of oxygen and discharged home on Augmentin.  He had completed Augmentin at this point.  He was working with physical therapy to get his strength back-I think this is a great idea and we discussed sometimes can take a month or 2 after hospitalization to regain strength.  I completed paperwork for FMLA and short-term disability from May 29 to June 11-he returned to work .  Given he had COVID-19 and after pneumonia I would recommend he work from home though A/P: ***   Superficial thrombophlebitis -1 month of aspirin 325 mg planned -after IV during pneumonia hospitalization June 2021   History of histoplasmosis From CT while hospitalized  "1. Dense patchy consolidation and ground-glass opacity in the  medial basilar right lower lobe, most compatible with pneumonia. Nonspecific noncalcified subcarinal adenopathy, potentially reactive. Recommend attention on follow-up chest CT with IV contrast in 3 months. 2. Moderate diffuse varicoid bronchiectasis, most prominent in the upper lobes. Thick bandlike regions of postinfectious scarring in the mid to upper right lung."   Plan had been discussed with pulmonology and 3 month repeat chest CT planned.  He seen pulmonology in early July.    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 From CT during this hospitalization "Chronic SVC occlusion with opacification of numerous ventral chest wall venous collaterals draining to the IVC. Finding probably due to remote granulomatous infection given calcified mediastinal adenopathy and calcified left upper lobe granuloma. 4. Dilated main pulmonary artery, suggesting pulmonary arterial hypertension. 5. Aortic Atherosclerosis (ICD10-I70.0) and Emphysema (ICD10-J43.9)."   -I considered ongoing aspirin therapy given SVC syndrome as well as loss of numerous venous  collaterals.  Would probably use aspirin 81 mg daily.  I am also open to pulmonology's opinion. -He does have pulmonology follow-up and I would like their opinion on pulmonary arterial hypertension/suspect related to histoplasmosis and prior encased.  I would also like their opinion on emphysema-possible PFTs A/P: ***   Emphysema lung (HCC) Never smoker-noted CT September 02, 2019-we will get pulmonology's opinion at follow-up.  We will discuss plans this visit *** A/P: ***   Aortic atherosclerosis (HCC) Noted after visit with patient n 06/21-we will need to discuss this at next physical--this would lower my threshold for statin therapy A/P: ***  Recommended follow up: No follow-ups on file. Future Appointments  Date Time Provider Department Center  01/04/2021  2:40 PM Shelva Majestic, MD [facility]  [facility]    No chief complaint on file.  Lab/Order associations:*** fasting No diagnosis found.  No orders of the defined types were placed in this encounter.   I,Jada Bradford,acting as a scribe for Tana Conch, MD.,have documented all relevant documentation on the behalf of Tana Conch, MD,as directed by  Tana Conch, MD while in the presence of Tana Conch, MD.  *** Return precautions advised.  Scheryl Marten

## 2021-01-04 ENCOUNTER — Encounter: Payer: BC Managed Care – PPO | Admitting: Family Medicine

## 2021-01-04 DIAGNOSIS — I700 Atherosclerosis of aorta: Secondary | ICD-10-CM

## 2021-01-04 DIAGNOSIS — I871 Compression of vein: Secondary | ICD-10-CM

## 2021-01-04 DIAGNOSIS — Z8619 Personal history of other infectious and parasitic diseases: Secondary | ICD-10-CM

## 2021-01-04 DIAGNOSIS — Z0000 Encounter for general adult medical examination without abnormal findings: Secondary | ICD-10-CM

## 2021-01-04 DIAGNOSIS — I808 Phlebitis and thrombophlebitis of other sites: Secondary | ICD-10-CM

## 2021-01-04 DIAGNOSIS — J439 Emphysema, unspecified: Secondary | ICD-10-CM

## 2021-01-04 DIAGNOSIS — J189 Pneumonia, unspecified organism: Secondary | ICD-10-CM

## 2021-01-16 IMAGING — CT CT CHEST W/ CM
2 of 3 series · 15 of 36 positions shown, 18 images · IV contrast (omnipaque)
Comparison: Chest radiograph from earlier today.

CLINICAL DATA: Generalized weakness, fever, chills and mild gist
for 2 days. History of COVID infection in [REDACTED]. Abnormal chest
radiograph.

EXAM:
CT CHEST WITH CONTRAST
TECHNIQUE: Multidetector CT imaging of the chest was performed during
intravenous contrast administration.
CONTRAST:  75mL OMNIPAQUE IOHEXOL 300 MG/ML  SOLN

[Series 2: axial st · axial · 0.77mm/px · z∈[-375,-57]mm · 12 of 187 slices shown, 15 images]
[im 14/187  mediastinal]
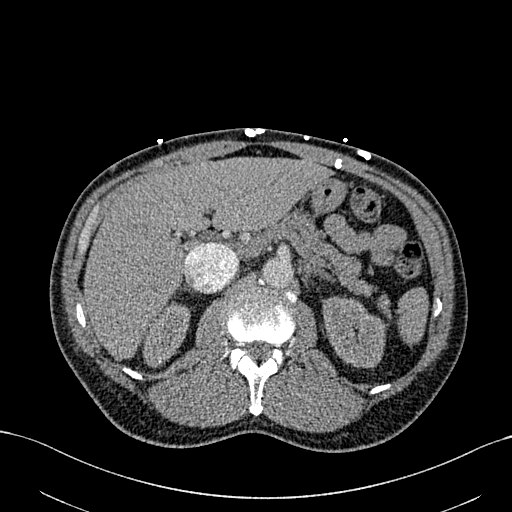
[im 14/187  lung]
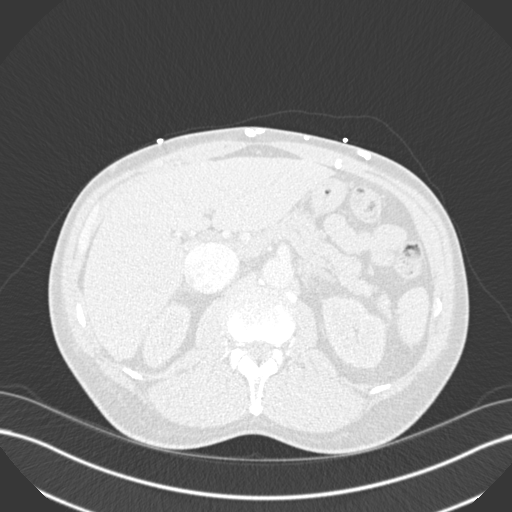
[im 28/187  lung]
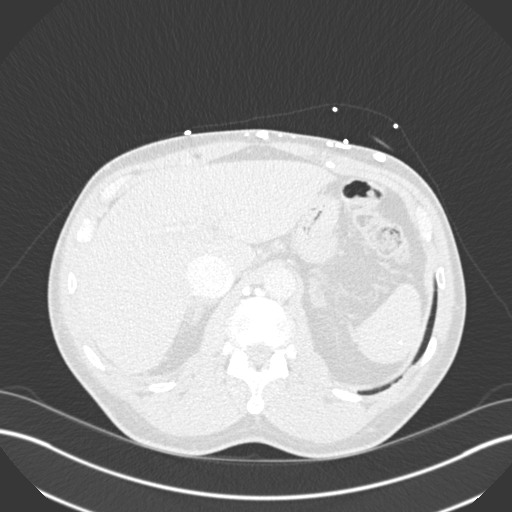
[im 42/187  lung]
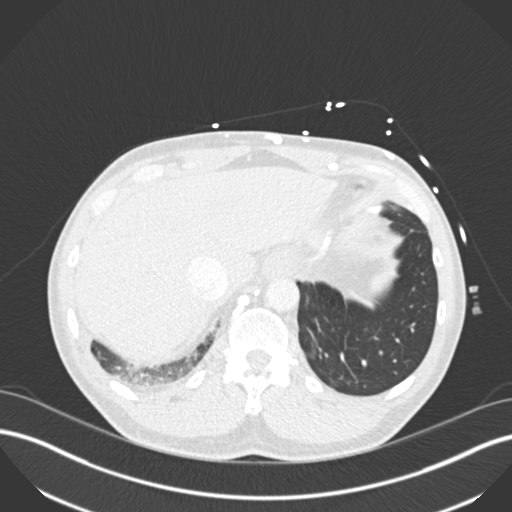
[im 56/187  lung]
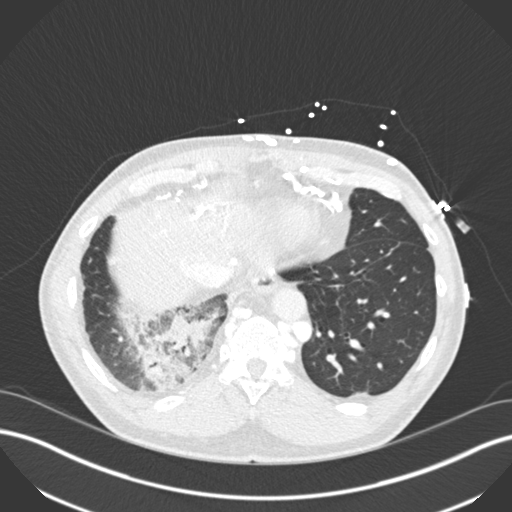
[im 69/187  mediastinal]
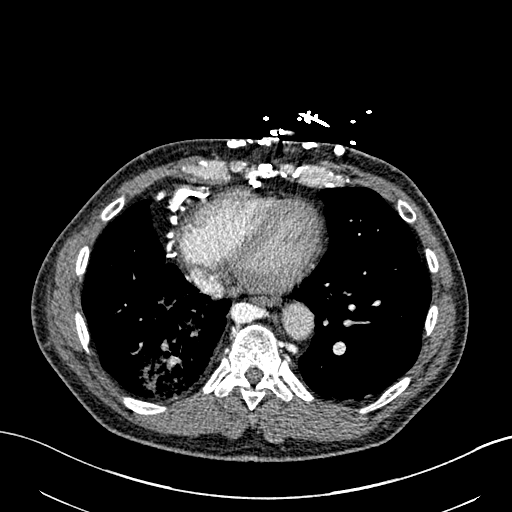
[im 69/187  lung]
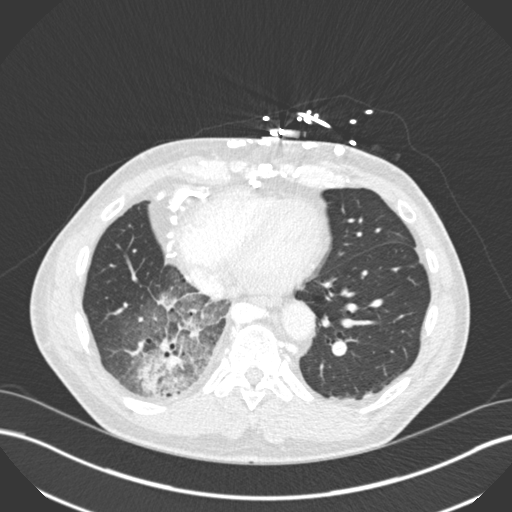
[im 83/187  lung]
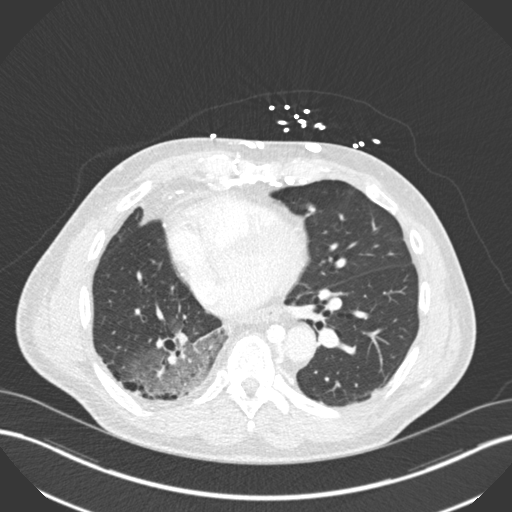
[im 104/187  lung]
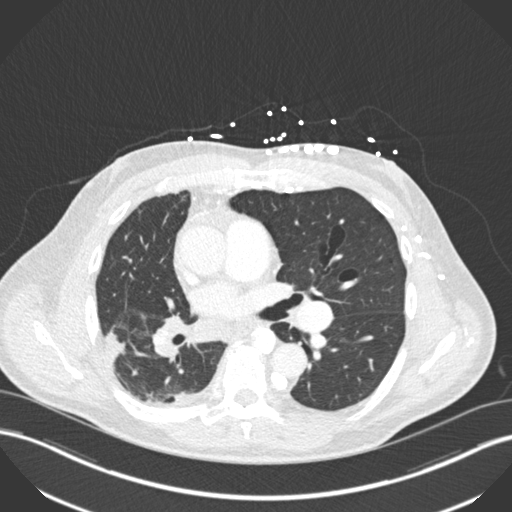
[im 118/187  lung]
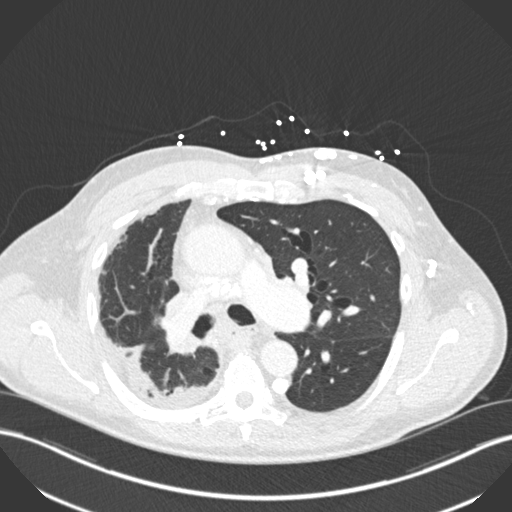
[im 131/187  mediastinal]
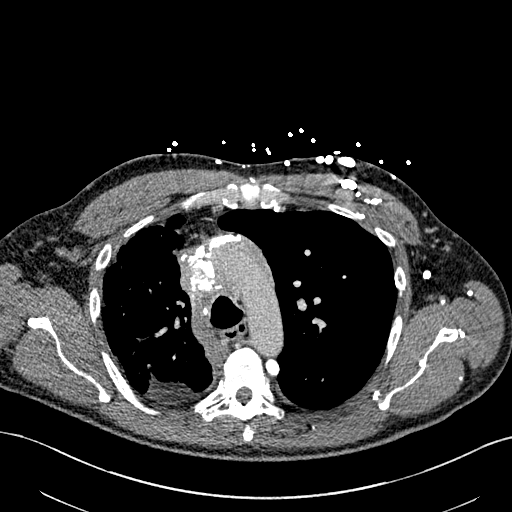
[im 131/187  lung]
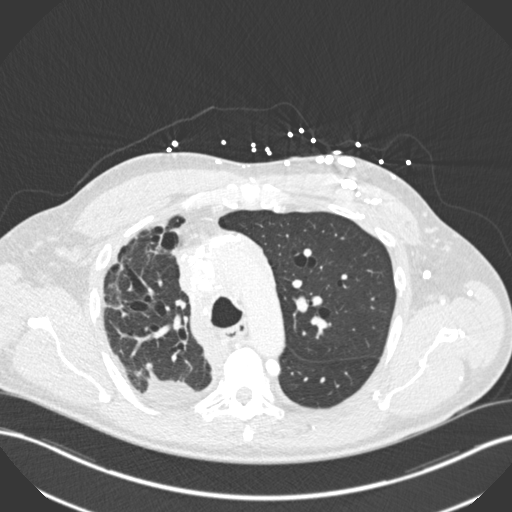
[im 145/187  lung]
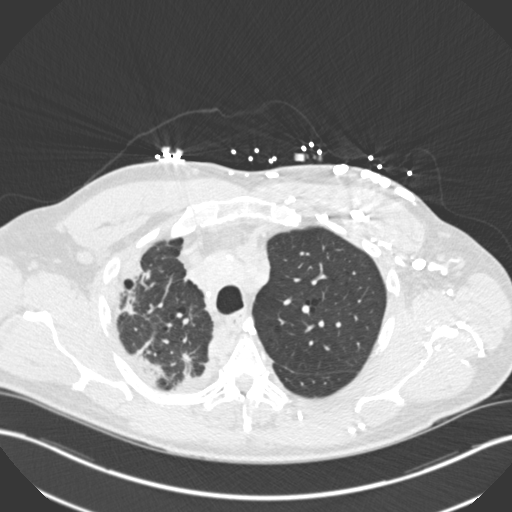
[im 159/187  lung]
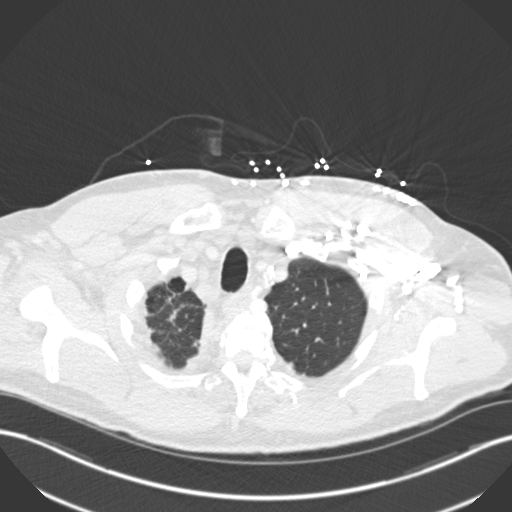
[im 173/187  lung]
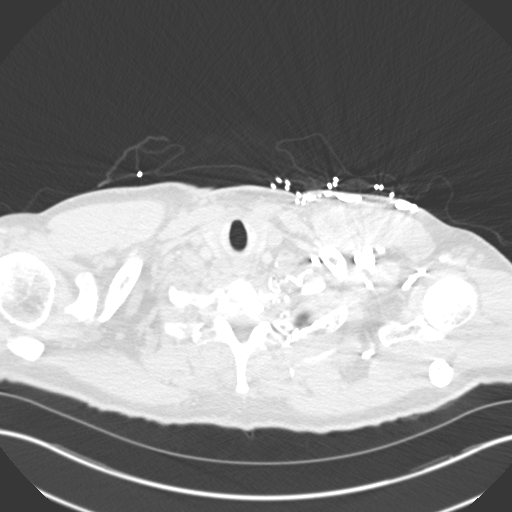

[Series 4: coronal · coronal · 0.79mm/px · 3 of 127 slices shown]
[im 26/127  lung]
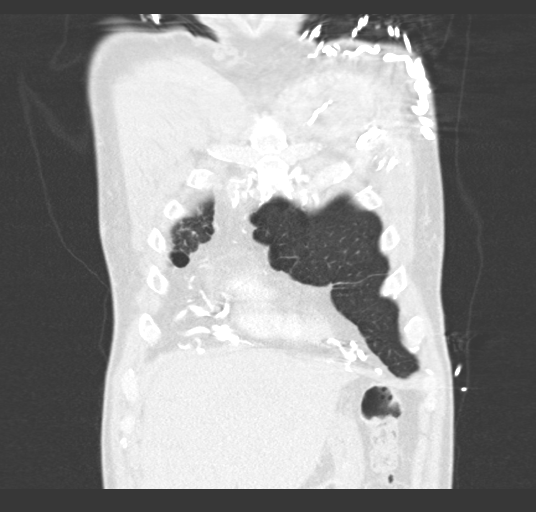
[im 51/127  lung]
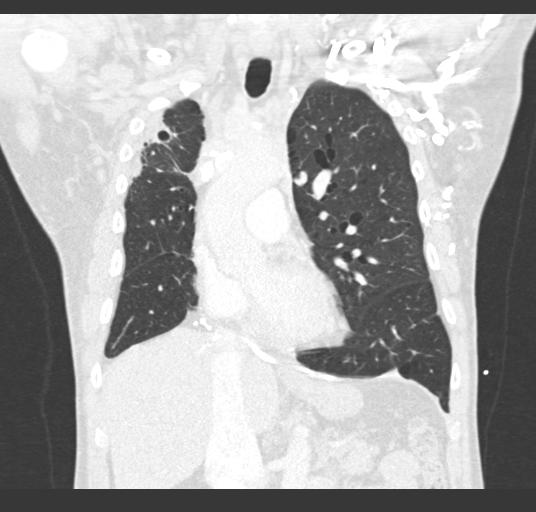
[im 76/127  lung]
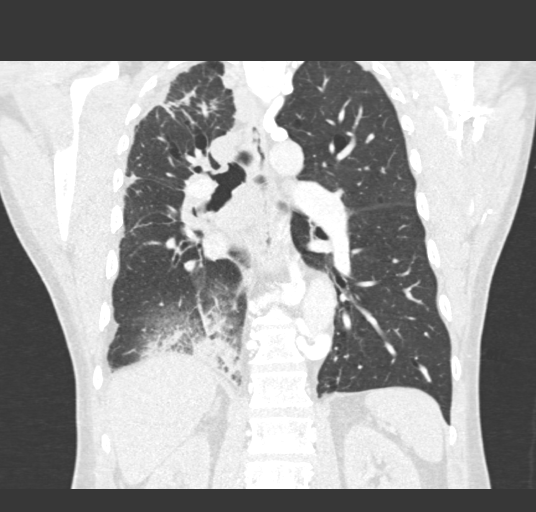

[15 of 36 positions shown; findings below may reference images not displayed]

FINDINGS: Cardiovascular: Normal heart size. No significant pericardial
effusion/thickening. Normal course and caliber of the thoracic
aorta. Dilated main pulmonary artery (3.5 cm diameter). Narrowing of
the right pulmonary artery adjacent to the calcified adenopathy. No
central pulmonary emboli. Chronic SVC occlusion with opacification
of numerous ventral chest wall venous collaterals draining to the
IVC.

Mediastinum/Nodes: No discrete thyroid nodules. Unremarkable
esophagus. No axillary adenopathy. Extensive calcified right
paratracheal adenopathy. Enlarged non calcified 2.0 cm subcarinal
node (series 2/image 80). No pathologically enlarged hilar nodes.

Lungs/Pleura: No pneumothorax. No pleural effusion. Dense patchy
consolidation and ground-glass opacity in medial basilar right lower
lobe. Moderate diffuse varicoid bronchiectasis, most prominent in
the upper lobes. Thick bandlike regions of consolidation with
associated volume loss and distortion in the peripheral mid to upper
right lung compatible with postinfectious scarring. Mild
centrilobular and paraseptal emphysema. Calcified medial left upper
lobe granuloma. No lung masses or significant pulmonary nodules.

Upper abdomen: Dilated IVC.  Calcified splenic granuloma.

Musculoskeletal: No aggressive appearing focal osseous lesions.
Moderate thoracic spondylosis.
IMPRESSION: 1. Dense patchy consolidation and ground-glass opacity in the medial
basilar right lower lobe, most compatible with pneumonia.
Nonspecific noncalcified subcarinal adenopathy, potentially
reactive. Recommend attention on follow-up chest CT with IV contrast
in 3 months.
2. Moderate diffuse varicoid bronchiectasis, most prominent in the
upper lobes. Thick bandlike regions of postinfectious scarring in
the mid to upper right lung.
3. Chronic SVC occlusion with opacification of numerous ventral
chest wall venous collaterals draining to the IVC. Finding probably
due to remote granulomatous infection given calcified mediastinal
adenopathy and calcified left upper lobe granuloma.
4. Dilated main pulmonary artery, suggesting pulmonary arterial
hypertension.
5. Aortic Atherosclerosis (G3NDZ-715.5) and Emphysema ([id number]-YFR.U).

## 2021-01-16 IMAGING — DX DG CHEST 1V PORT
1 series · 1 of 1 positions shown · non-contrast
Comparison: None.

CLINICAL DATA: Fever, dyspnea

EXAM:
PORTABLE CHEST 1 VIEW

[chest ap]
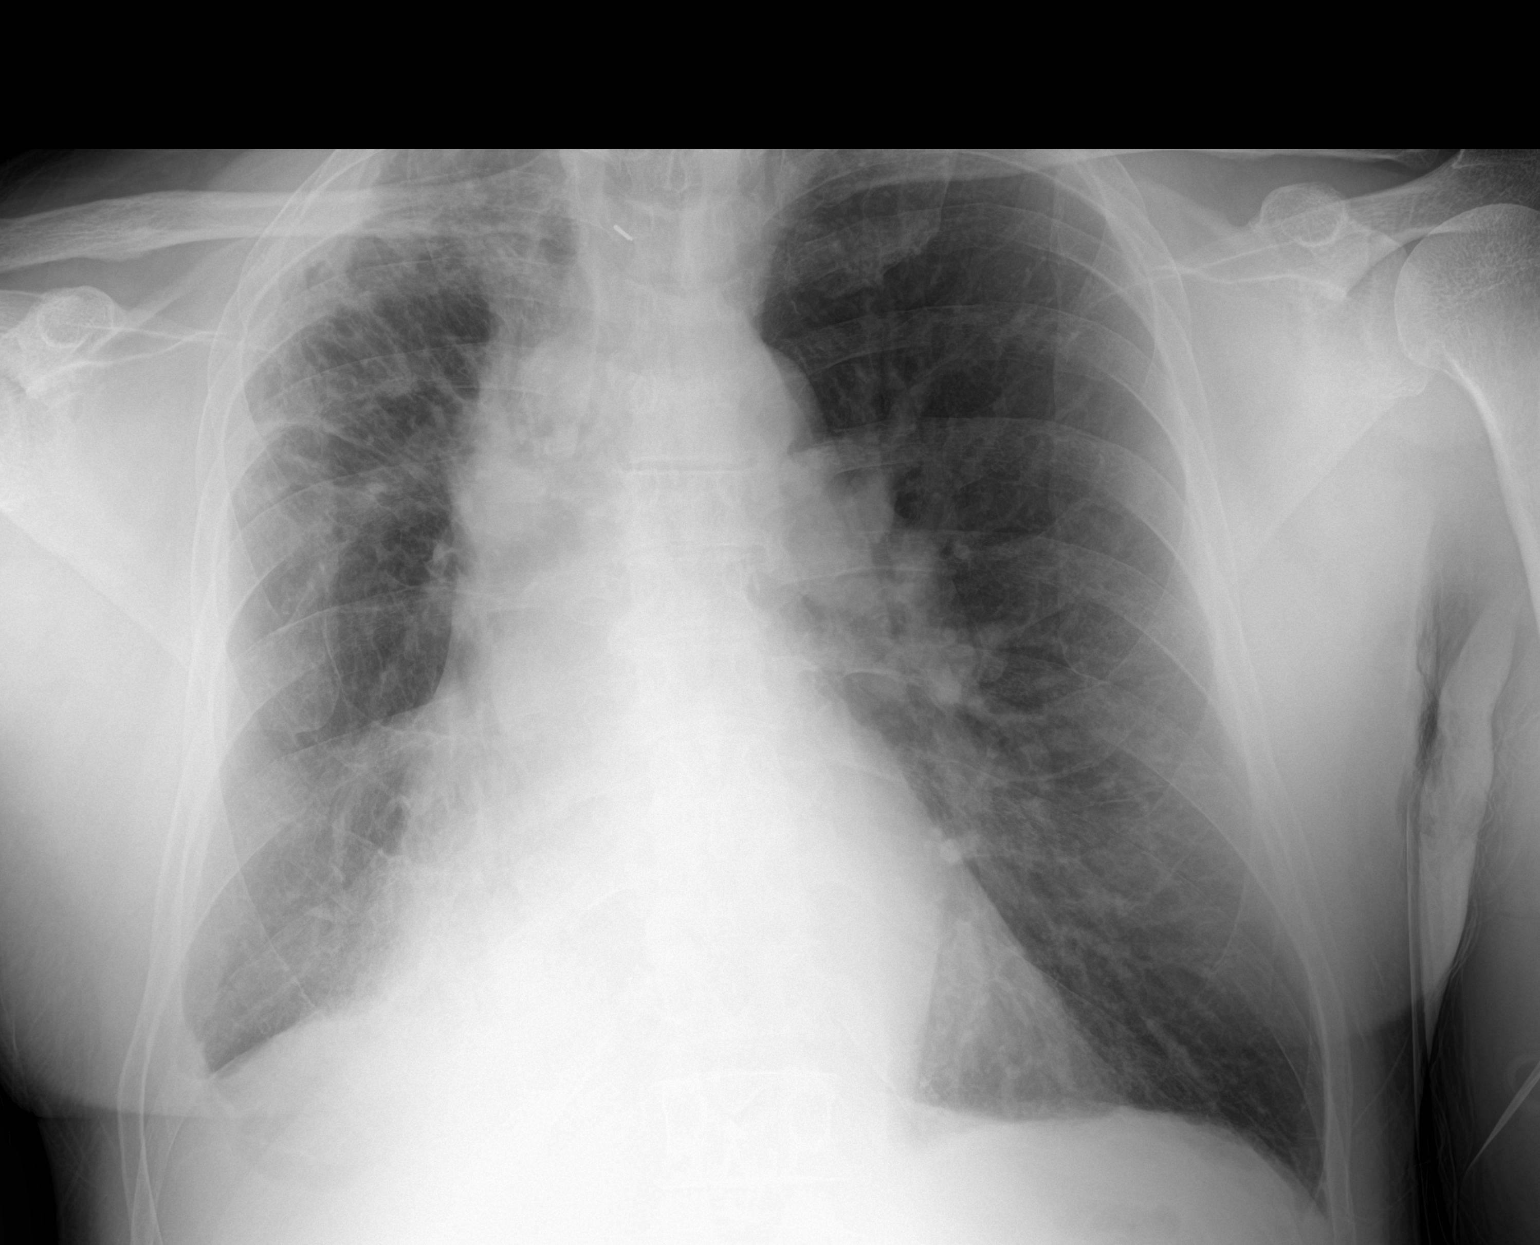

[1 of 1 positions shown; findings below may reference images not displayed]

FINDINGS: No pneumothorax. Blunting of the right costophrenic angle, cannot
exclude small right pleural effusion. No left pleural effusion.
Volume loss in the right hemithorax. Patchy reticular opacities and
distortion throughout the right lung, most prominent at the right
lung apex with associated right pleural thickening. Clear left lung.
Mild prominence of the bilateral hila. Mild thickening of the right
paratracheal stripe. Top-normal heart size.
IMPRESSION: 1. Mild prominence of the bilateral hila and mild thickening of the
right paratracheal stripe, cannot exclude adenopathy. Suggest
further evaluation with chest CT with IV contrast. Comparison with
any prior outside chest imaging would be helpful.
2. Volume loss in the right hemithorax. Patchy reticular opacities
and distortion throughout the right lung, most prominent at the
right lung apex, with associated right pleural thickening. Findings
are most suggestive of chronic postinfectious/postinflammatory
scarring.
